# Patient Record
Sex: Female | Born: 1955
Health system: Southern US, Community
[De-identification: ages and names within clinical notes are randomized; demographics above are authoritative.]

## PROBLEM LIST (undated history)

## (undated) DIAGNOSIS — R7303 Prediabetes: Secondary | ICD-10-CM

## (undated) DIAGNOSIS — M549 Dorsalgia, unspecified: Secondary | ICD-10-CM

## (undated) DIAGNOSIS — E785 Hyperlipidemia, unspecified: Secondary | ICD-10-CM

## (undated) DIAGNOSIS — E559 Vitamin D deficiency, unspecified: Secondary | ICD-10-CM

## (undated) HISTORY — DX: Hyperlipidemia, unspecified: E78.5

## (undated) HISTORY — DX: Dorsalgia, unspecified: M54.9

## (undated) HISTORY — DX: Vitamin D deficiency, unspecified: E55.9

## (undated) HISTORY — DX: Prediabetes: R73.03

---

## 2011-05-14 LAB — HM COLONOSCOPY

## 2012-03-04 ENCOUNTER — Other Ambulatory Visit: Payer: Self-pay

## 2012-03-04 DIAGNOSIS — Z1231 Encounter for screening mammogram for malignant neoplasm of breast: Secondary | ICD-10-CM

## 2012-04-06 ENCOUNTER — Ambulatory Visit
Admission: RE | Admit: 2012-04-06 | Discharge: 2012-04-06 | Disposition: A | Discharge status: Home / Self Care | Payer: Federal, State, Local not specified - PPO | Source: Ambulatory Visit

## 2012-04-06 DIAGNOSIS — Z1231 Encounter for screening mammogram for malignant neoplasm of breast: Secondary | ICD-10-CM

## 2012-07-04 ENCOUNTER — Emergency Department (HOSPITAL_COMMUNITY)
Admission: EM | Admit: 2012-07-04 | Discharge: 2012-07-04 | Disposition: A | Discharge status: Home / Self Care | Payer: Federal, State, Local not specified - PPO | Attending: Emergency Medicine | Admitting: Emergency Medicine

## 2012-07-04 ENCOUNTER — Encounter (HOSPITAL_COMMUNITY): Payer: Self-pay | Admitting: Emergency Medicine

## 2012-07-04 DIAGNOSIS — R519 Headache, unspecified: Secondary | ICD-10-CM

## 2012-07-04 DIAGNOSIS — Y92410 Unspecified street and highway as the place of occurrence of the external cause: Secondary | ICD-10-CM | POA: Insufficient documentation

## 2012-07-04 DIAGNOSIS — S0993XA Unspecified injury of face, initial encounter: Secondary | ICD-10-CM | POA: Insufficient documentation

## 2012-07-04 DIAGNOSIS — Y9389 Activity, other specified: Secondary | ICD-10-CM | POA: Insufficient documentation

## 2012-07-04 DIAGNOSIS — S199XXA Unspecified injury of neck, initial encounter: Secondary | ICD-10-CM | POA: Insufficient documentation

## 2012-07-04 DIAGNOSIS — IMO0002 Reserved for concepts with insufficient information to code with codable children: Secondary | ICD-10-CM | POA: Insufficient documentation

## 2012-07-04 DIAGNOSIS — S0990XA Unspecified injury of head, initial encounter: Secondary | ICD-10-CM | POA: Insufficient documentation

## 2012-07-04 DIAGNOSIS — M549 Dorsalgia, unspecified: Secondary | ICD-10-CM

## 2012-07-04 MED ORDER — CYCLOBENZAPRINE HCL 10 MG PO TABS: 10.0000 mg | ORAL_TABLET | Freq: Two times a day (BID) | ORAL | Status: DC | PRN

## 2012-07-04 MED ORDER — TRAMADOL HCL 50 MG PO TABS: 50.0000 mg | ORAL_TABLET | Freq: Four times a day (QID) | ORAL | Status: DC | PRN
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
## 2012-07-05 NOTE — ED Provider Notes (Signed)
Medical screening examination/treatment/procedure(s) were performed by non-physician practitioner and as supervising physician I was immediately available for consultation/collaboration.  Kinslie Hove E Erasto Sleight, MD 07/05/12 0749 

## 2012-07-31 ENCOUNTER — Ambulatory Visit
Admission: RE | Admit: 2012-07-31 | Discharge: 2012-07-31 | Disposition: A | Discharge status: Home / Self Care | Payer: Federal, State, Local not specified - PPO | Source: Ambulatory Visit | Attending: Nurse Practitioner | Admitting: Nurse Practitioner

## 2012-07-31 ENCOUNTER — Other Ambulatory Visit: Payer: Self-pay | Admitting: Nurse Practitioner

## 2012-07-31 DIAGNOSIS — M545 Low back pain, unspecified: Secondary | ICD-10-CM

## 2012-07-31 DIAGNOSIS — M542 Cervicalgia: Secondary | ICD-10-CM

## 2013-05-25 ENCOUNTER — Other Ambulatory Visit: Payer: Self-pay

## 2013-05-25 DIAGNOSIS — Z1231 Encounter for screening mammogram for malignant neoplasm of breast: Secondary | ICD-10-CM

## 2013-06-01 ENCOUNTER — Encounter (INDEPENDENT_AMBULATORY_CARE_PROVIDER_SITE_OTHER): Payer: Self-pay

## 2013-06-01 ENCOUNTER — Ambulatory Visit
Admission: RE | Admit: 2013-06-01 | Discharge: 2013-06-01 | Disposition: A | Discharge status: Home / Self Care | Payer: Federal, State, Local not specified - PPO | Source: Ambulatory Visit

## 2013-06-01 DIAGNOSIS — Z1231 Encounter for screening mammogram for malignant neoplasm of breast: Secondary | ICD-10-CM

## 2013-09-02 ENCOUNTER — Ambulatory Visit (INDEPENDENT_AMBULATORY_CARE_PROVIDER_SITE_OTHER): Payer: Federal, State, Local not specified - PPO | Admitting: Neurology

## 2013-09-02 ENCOUNTER — Ambulatory Visit (INDEPENDENT_AMBULATORY_CARE_PROVIDER_SITE_OTHER): Payer: Self-pay | Admitting: Radiology

## 2013-09-02 DIAGNOSIS — G5603 Carpal tunnel syndrome, bilateral upper limbs: Secondary | ICD-10-CM

## 2013-09-02 DIAGNOSIS — Z0289 Encounter for other administrative examinations: Secondary | ICD-10-CM

## 2013-09-02 DIAGNOSIS — G5600 Carpal tunnel syndrome, unspecified upper limb: Secondary | ICD-10-CM

## 2013-09-03 NOTE — Progress Notes (Signed)
  [facility]   Provider:  Dr Jaynee Eagles Referring Provider: Cristine Polio, MD Primary Care Physician:  No primary provider on file.  HPI:  Krista Miles is a 58 y.o. female with a PMHx of diabetes who is here as a referral from Dr. Towanda Malkin for bilateral Carpal Tunnel evaluation. Patient reports that both her hands ache. Digits 1-4 are affected. She wakes up in the middle of the night with numbness and tingling in her hands. Right hand is worse. Denies neck pain and radicular symptoms. No paresthesias in the toes. Focused exam demonstrates some mild left thenar flattening and weak median intrinsic hand muscles.   Summary:  Evaluation of the left median motor nerve showed prolonged distal onset latency (6.1 ms, N<4.).  The right median motor nerve showed prolonged distal onset latency (8.7 ms, N<4.0).   The left Median 2nd Digit sensory nerve showed no response.  The right Median 2nd Digit sensory nerve showed no response.   Evaluation of the left Ulnar ADM motor nerve showed an 11 m/s drop across the elbow section (N<24m/s).    F Wave studies indicate that the right median F wave has prolonged latency (35.22ms, N<34).  EMG needle study of the right Deltoid, Triceps, Biceps, Pronator Teres, Flexor Digitorum Profundus(Ulnar head) and First Dorsal Interosseus muscles were normal. Needle study terminated prematurely as patient could not tolerate.   Conclusion: This is a limited study as the EMG needle exam was terminated prematurely due to pain. There is moderately-severe right > left bilateral Carpal Tunnel Syndrome. 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 Unable to assess for radiculopathy without more thorough EMG study. No indication of peripheral polyneuropathy.   [name], MD  [facility] [address] [place], [place] [address]  Phone [phone] Fax [phone]

## 2014-01-02 ENCOUNTER — Emergency Department (HOSPITAL_COMMUNITY)
Admission: EM | Admit: 2014-01-02 | Discharge: 2014-01-02 | Disposition: A | Discharge status: Home / Self Care | Payer: Federal, State, Local not specified - PPO | Attending: Emergency Medicine | Admitting: Emergency Medicine

## 2014-01-02 ENCOUNTER — Emergency Department (HOSPITAL_COMMUNITY): Payer: Federal, State, Local not specified - PPO

## 2014-01-02 DIAGNOSIS — Z79899 Other long term (current) drug therapy: Secondary | ICD-10-CM | POA: Insufficient documentation

## 2014-01-02 DIAGNOSIS — R1013 Epigastric pain: Secondary | ICD-10-CM | POA: Insufficient documentation

## 2014-01-02 DIAGNOSIS — R079 Chest pain, unspecified: Secondary | ICD-10-CM | POA: Diagnosis not present

## 2014-01-02 DIAGNOSIS — R110 Nausea: Secondary | ICD-10-CM | POA: Insufficient documentation

## 2014-01-02 DIAGNOSIS — Z791 Long term (current) use of non-steroidal anti-inflammatories (NSAID): Secondary | ICD-10-CM | POA: Diagnosis not present

## 2014-01-02 LAB — I-STAT TROPONIN, ED: Troponin i, poc: 0 ng/mL (ref 0.00–0.08)

## 2014-01-02 LAB — BASIC METABOLIC PANEL
Anion gap: 5 (ref 5–15)
BUN: 12 mg/dL (ref 6–23)
CO2: 30 mmol/L (ref 19–32)
Calcium: 9.1 mg/dL (ref 8.4–10.5)
Chloride: 104 mEq/L (ref 96–112)
Creatinine, Ser: 0.81 mg/dL (ref 0.50–1.10)
GFR calc Af Amer: >90 mL/min (ref >90)
GFR calc non Af Amer: 78 mL/min — ABNORMAL LOW (ref >90)
Glucose, Bld: 107 mg/dL — ABNORMAL HIGH (ref 70–99)
Potassium: 4 mmol/L (ref 3.5–5.1)
Sodium: 139 mmol/L (ref 135–145)

## 2014-01-02 LAB — CBC
HCT: 38.8 % (ref 36.0–46.0)
Hemoglobin: 12.3 g/dL (ref 12.0–15.0)
MCH: 29.4 pg (ref 26.0–34.0)
MCHC: 31.7 g/dL (ref 30.0–36.0)
MCV: 92.8 fL (ref 78.0–100.0)
Platelets: 239 10*3/uL (ref 150–400)
RBC: 4.18 MIL/uL (ref 3.87–5.11)
RDW: 13.1 % (ref 11.5–15.5)
WBC: 6.1 10*3/uL (ref 4.0–10.5)

## 2014-01-02 MED ORDER — SUCRALFATE 1 G PO TABS: 1.0000 g | ORAL_TABLET | Freq: Three times a day (TID) | ORAL | Status: DC

## 2014-01-02 MED ORDER — FAMOTIDINE 20 MG PO TABS: 20.0000 mg | ORAL_TABLET | Freq: Two times a day (BID) | ORAL | Status: DC

## 2014-01-02 MED ORDER — PANTOPRAZOLE SODIUM 20 MG PO TBEC: 20.0000 mg | DELAYED_RELEASE_TABLET | Freq: Every day | ORAL | Status: DC

## 2014-01-02 MED ORDER — GI COCKTAIL ~~LOC~~
30.0000 mL | Freq: Once | ORAL | Status: AC
  Administered 2014-01-02: 30 mL via ORAL
  Filled 2014-01-02: qty 30

## 2014-01-02 NOTE — Discharge Instructions (Signed)
Take protonix and pepcid as prescribed daily. carafate as needed. Please follow up with primary care doctor and gastroenterologist as soon as able for recheck and further evaluation. Return if symptoms worsening.     Gastritis, Adult Gastritis is soreness and swelling (inflammation) of the lining of the stomach. Gastritis can develop as a sudden onset (acute) or long-term (chronic) condition. If gastritis is not treated, it can lead to stomach bleeding and ulcers. CAUSES  Gastritis occurs when the stomach lining is weak or damaged. Digestive juices from the stomach then inflame the weakened stomach lining. The stomach lining may be weak or damaged due to viral or bacterial infections. One common bacterial infection is the Helicobacter pylori infection. Gastritis can also result from excessive alcohol consumption, taking certain medicines, or having too much acid in the stomach.  SYMPTOMS  In some cases, there are no symptoms. When symptoms are present, they may include:  Pain or a burning sensation in the upper abdomen.  Nausea.  Vomiting.  An uncomfortable feeling of fullness after eating. DIAGNOSIS  Your caregiver may suspect you have gastritis based on your symptoms and a physical exam. To determine the cause of your gastritis, your caregiver may perform the following:  Blood or stool tests to check for the H pylori bacterium.  Gastroscopy. A thin, flexible tube (endoscope) is passed down the esophagus and into the stomach. The endoscope has a light and camera on the end. Your caregiver uses the endoscope to view the inside of the stomach.  Taking a tissue sample (biopsy) from the stomach to examine under a microscope. TREATMENT  Depending on the cause of your gastritis, medicines may be prescribed. If you have a bacterial infection, such as an H pylori infection, antibiotics may be given. If your gastritis is caused by too much acid in the stomach, H2 blockers or antacids may be given.  Your caregiver may recommend that you stop taking aspirin, ibuprofen, or other nonsteroidal anti-inflammatory drugs (NSAIDs). HOME CARE INSTRUCTIONS  Only take over-the-counter or prescription medicines as directed by your caregiver.  If you were given antibiotic medicines, take them as directed. Finish them even if you start to feel better.  Drink enough fluids to keep your urine clear or pale yellow.  Avoid foods and drinks that make your symptoms worse, such as:  Caffeine or alcoholic drinks.  Chocolate.  Peppermint or mint flavorings.  Garlic and onions.  Spicy foods.  Citrus fruits, such as oranges, lemons, or limes.  Tomato-based foods such as sauce, chili, salsa, and pizza.  Fried and fatty foods.  Eat small, frequent meals instead of large meals. SEEK IMMEDIATE MEDICAL CARE IF:   You have black or dark red stools.  You vomit blood or material that looks like coffee grounds.  You are unable to keep fluids down.  Your abdominal pain gets worse.  You have a fever.  You do not feel better after 1 week.  You have any other questions or concerns. MAKE SURE YOU:  Understand these instructions.  Will watch your condition.  Will get help right away if you are not doing well or get worse. Document Released: 12/18/2000 Document Revised: 06/25/2011 Document Reviewed: 02/06/2011 [facility] Patient Information 2015 [place], [place]. This information is not intended to replace advice given to you by your health care provider. Make sure you discuss any questions you have with your health care provider.  Peptic Ulcer A peptic ulcer is a sore in the lining of your esophagus (esophageal ulcer), stomach (gastric  ulcer), or in the first part of your small intestine (duodenal ulcer). The ulcer causes erosion into the deeper tissue. CAUSES  Normally, the lining of the stomach and the small intestine protects itself from the acid that digests food. The protective lining can be  damaged by:  An infection caused by a bacterium called Helicobacter pylori (H. pylori).  Regular use of nonsteroidal anti-inflammatory drugs (NSAIDs), such as ibuprofen or aspirin.  Smoking tobacco. Other risk factors include being older than 53, drinking alcohol excessively, and having a family history of ulcer disease.  SYMPTOMS   Burning pain or gnawing in the area between the chest and the belly button.  Heartburn.  Nausea and vomiting.  Bloating. The pain can be worse on an empty stomach and at night. If the ulcer results in bleeding, it can cause:  Black, tarry stools.  Vomiting of bright red blood.  Vomiting of coffee-ground-looking materials. DIAGNOSIS  A diagnosis is usually made based upon your history and an exam. Other tests and procedures may be performed to find the cause of the ulcer. Finding a cause will help determine the best treatment. Tests and procedures may include:  Blood tests, stool tests, or breath tests to check for the bacterium H. pylori.  An upper gastrointestinal (GI) series of the esophagus, stomach, and small intestine.  An endoscopy to examine the esophagus, stomach, and small intestine.  A biopsy. TREATMENT  Treatment may include:  Eliminating the cause of the ulcer, such as smoking, NSAIDs, or alcohol.  Medicines to reduce the amount of acid in your digestive tract.  Antibiotic medicines if the ulcer is caused by the H. pylori bacterium.  An upper endoscopy to treat a bleeding ulcer.  Surgery if the bleeding is severe or if the ulcer created a hole somewhere in the digestive system. HOME CARE INSTRUCTIONS   Avoid tobacco, alcohol, and caffeine. Smoking can increase the acid in the stomach, and continued smoking will impair the healing of ulcers.  Avoid foods and drinks that seem to cause discomfort or aggravate your ulcer.  Only take medicines as directed by your caregiver. Do not substitute over-the-counter medicines for  prescription medicines without talking to your caregiver.  Keep any follow-up appointments and tests as directed. SEEK MEDICAL CARE IF:   Your do not improve within 7 days of starting treatment.  You have ongoing indigestion or heartburn. SEEK IMMEDIATE MEDICAL CARE IF:   You have sudden, sharp, or persistent abdominal pain.  You have bloody or dark black, tarry stools.  You vomit blood or vomit that looks like coffee grounds.  You become light-headed, weak, or feel faint.  You become sweaty or clammy. MAKE SURE YOU:   Understand these instructions.  Will watch your condition.  Will get help right away if you are not doing well or get worse. Document Released: 12/22/1999 Document Revised: 05/10/2013 Document Reviewed: 07/24/2011 [facility] Patient Information 2015 [place], [place]. This information is not intended to replace advice given to you by your health care provider. Make sure you discuss any questions you have with your health care provider.

## 2014-01-02 NOTE — ED Notes (Signed)
Pt reports sternal/chest pain, esophageal area x3 weeks. Pain 710. Reports sometimes nexium helps. Denies n/v/d.

## 2014-01-02 NOTE — ED Notes (Signed)
Awake. Verbally responsive. A/O x4. Resp even and unlabored. No audible adventitious breath sounds noted. ABC's intact. SR on monitor at 71bpm. NAD noted.

## 2014-01-02 NOTE — ED Provider Notes (Signed)
CSN: [id number]     Arrival date & time 01/02/14  1735 History   First MD Initiated Contact with Patient 01/02/14 2006     Chief Complaint  Patient presents with  . Chest Pain     (Consider location/radiation/quality/duration/timing/severity/associated sxs/prior Treatment) HPI Krista Miles is a 58 y.o. female with no medical problems, presents to emergency department complaining of epigastric abdominal pain and chest pain. Patient states pain has been intermittent for the last 3 weeks. States mainly worse with eating. States at times it is painful even when she does not eat. No exertional symptoms. No shortness of breath. No associated diaphoresis, dizziness, lightheadedness. Currently asymptomatic. States she has not seen her doctor for this. No prior cardiac history. Recently started on NSAIDs for pain. She admits to heavy caffeine use. Denies any fever or chills. No urinary symptoms. No recent travel or surgeries. No lower extremity swelling.  No past medical history on file. No past surgical history on file. No family history on file. History  Substance Use Topics  . Smoking status: Never Smoker   . Smokeless tobacco: Not on file  . Alcohol Use: No   OB History    No data available     Review of Systems  Constitutional: Negative for fever and chills.  Respiratory: Positive for chest tightness. Negative for cough and shortness of breath.   Cardiovascular: Positive for chest pain. Negative for palpitations and leg swelling.  Gastrointestinal: Positive for nausea and abdominal pain. Negative for vomiting, diarrhea and constipation.  Genitourinary: Negative for dysuria and flank pain.  Musculoskeletal: Negative for myalgias, arthralgias, neck pain and neck stiffness.  Skin: Negative for rash.  Neurological: Negative for dizziness, weakness and headaches.  All other systems reviewed and are negative.     Allergies  Review of patient's allergies indicates no known  allergies.  Home Medications   Prior to Admission medications   Medication Sig Start Date End Date Taking? Authorizing Provider  acetaminophen (TYLENOL) 500 MG tablet Take 500 mg by mouth every 6 (six) hours as needed for pain.   Yes Historical Provider, MD  diclofenac (VOLTAREN) 75 MG EC tablet Take 75 mg by mouth 2 (two) times daily. Take twice a day with food for 2 weeks, then 1 tablet for 2 weeks and then take 1 tablet as needed   Yes Historical Provider, MD  metFORMIN (GLUCOPHAGE) 500 MG tablet Take 500 mg by mouth daily with breakfast.   Yes Historical Provider, MD  traMADol (ULTRAM) 50 MG tablet Take 1 tablet (50 mg total) by mouth every 6 (six) hours as needed for pain. 07/04/12  Yes Jennifer L Piepenbrink, PA-C  cyclobenzaprine (FLEXERIL) 10 MG tablet Take 1 tablet (10 mg total) by mouth 2 (two) times daily as needed for muscle spasms. Patient not taking: Reported on 01/02/2014 07/04/12   Stephani Police Piepenbrink, PA-C  famotidine (PEPCID) 20 MG tablet Take 1 tablet (20 mg total) by mouth 2 (two) times daily. 01/02/14   Karyna Bessler A Eretria Manternach, PA-C  pantoprazole (PROTONIX) 20 MG tablet Take 1 tablet (20 mg total) by mouth daily. 01/02/14   Jonise Weightman A Malania Gawthrop, PA-C  sucralfate (CARAFATE) 1 G tablet Take 1 tablet (1 g total) by mouth 4 (four) times daily -  with meals and at bedtime. 01/02/14   Nelwyn Hebdon A Brileigh Sevcik, PA-C   BP 119/67 mmHg  Pulse 72  Temp(Src) 98.5 F (36.9 C) (Oral)  Resp 11  SpO2 100% Physical Exam  Constitutional: She appears well-developed and well-nourished. No  distress.  HENT:  Head: Normocephalic.  Eyes: Conjunctivae are normal.  Neck: Neck supple.  Cardiovascular: Normal rate, regular rhythm and normal heart sounds.   Pulmonary/Chest: Effort normal and breath sounds normal. No respiratory distress. She has no wheezes. She has no rales. She exhibits no tenderness.  Abdominal: Soft. Bowel sounds are normal. She exhibits no distension. There is tenderness. There  is no rebound.  Epigastric tenderness  Musculoskeletal: She exhibits no edema.  Neurological: She is alert.  Skin: Skin is warm and dry.  Psychiatric: She has a normal mood and affect. Her behavior is normal.  Nursing note and vitals reviewed.   ED Course  Procedures (including critical care time) Labs Review Labs Reviewed  BASIC METABOLIC PANEL - Abnormal; Notable for the following:    Glucose, Bld 107 (*)    GFR calc non Af Amer 78 (*)    All other components within normal limits  CBC  I-STAT TROPOININ, ED    Imaging Review Dg Chest 2 View  01/02/2014   CLINICAL DATA:  Chest pain.  EXAM: CHEST  2 VIEW  COMPARISON:  None.  FINDINGS: Minimal lingular atelectasis or scarring. Right lung is clear. No effusions. Heart is normal size. Mediastinal contours are within normal limits. No acute bony abnormality.  IMPRESSION: Minimal lingular scarring or atelectasis.  No active disease.   Electronically Signed   By: Rolm Baptise M.D.   On: 01/02/2014 20:56     EKG Interpretation   Date/Time:  Sunday January 02 2014 17:45:51 EST Ventricular Rate:  88 PR Interval:  160 QRS Duration: 74 QT Interval:  353 QTC Calculation: 427 R Axis:   57 Text Interpretation:  Sinus rhythm Low voltage, precordial leads  Borderline T abnormalities, anterior leads Confirmed by KOHUT  MD, STEPHEN  ([id number]) on 01/02/2014 9:08:14 PM      MDM   Final diagnoses:  Chest pain  Epigastric pain    Pt with epigastric abdominal pain, chest pain, mainly after eating. States "I think i have ulcers." pt has no prior medical problems. Pt is low risk for cardiac disease, her TIMI score is 0, heart score of 2. Labs and cxr ordered.    Pt's labs and CXR negative. ECG with non specific findings. Given low risk wll dc home. Most likely gastritis. Pt received GI cocktail and feels much better. Will d/c home with close outpatient follow up. Will treat with pepcid, carafate, protonix.   Filed Vitals:   01/02/14 1746  01/02/14 2101  BP: 111/73 119/67  Pulse: 86 72  Temp: 98.5 F (36.9 C)   TempSrc: Oral   Resp: 20 11  SpO2: 100% 100%      Renold Genta, PA-C 01/02/14 2321  Virgel Manifold, MD 01/03/14 1455

## 2014-01-02 NOTE — ED Notes (Signed)
Awake. Verbally responsive. A/O x4. Resp even and unlabored. No audible adventitious breath sounds noted. ABC's intact. SR on monitor at 81bpm.

## 2014-01-07 HISTORY — PX: HAND SURGERY: SHX662

## 2014-05-30 ENCOUNTER — Other Ambulatory Visit: Payer: Self-pay

## 2014-05-30 DIAGNOSIS — Z1231 Encounter for screening mammogram for malignant neoplasm of breast: Secondary | ICD-10-CM

## 2014-06-03 ENCOUNTER — Ambulatory Visit
Admission: RE | Admit: 2014-06-03 | Discharge: 2014-06-03 | Disposition: A | Discharge status: Home / Self Care | Payer: Federal, State, Local not specified - PPO | Source: Ambulatory Visit

## 2014-06-03 DIAGNOSIS — Z1231 Encounter for screening mammogram for malignant neoplasm of breast: Secondary | ICD-10-CM

## 2015-05-15 DIAGNOSIS — Z0000 Encounter for general adult medical examination without abnormal findings: Secondary | ICD-10-CM | POA: Diagnosis not present

## 2015-05-15 DIAGNOSIS — H6123 Impacted cerumen, bilateral: Secondary | ICD-10-CM | POA: Diagnosis not present

## 2015-06-26 DIAGNOSIS — J029 Acute pharyngitis, unspecified: Secondary | ICD-10-CM | POA: Diagnosis not present

## 2015-07-15 DIAGNOSIS — E119 Type 2 diabetes mellitus without complications: Secondary | ICD-10-CM | POA: Diagnosis not present

## 2015-08-07 DIAGNOSIS — R51 Headache: Secondary | ICD-10-CM | POA: Diagnosis not present

## 2015-08-07 DIAGNOSIS — G5603 Carpal tunnel syndrome, bilateral upper limbs: Secondary | ICD-10-CM | POA: Diagnosis not present

## 2015-08-07 DIAGNOSIS — M542 Cervicalgia: Secondary | ICD-10-CM | POA: Diagnosis not present

## 2015-08-07 DIAGNOSIS — E785 Hyperlipidemia, unspecified: Secondary | ICD-10-CM | POA: Diagnosis not present

## 2015-08-30 DIAGNOSIS — M9902 Segmental and somatic dysfunction of thoracic region: Secondary | ICD-10-CM | POA: Diagnosis not present

## 2015-08-30 DIAGNOSIS — M5032 Other cervical disc degeneration, mid-cervical region, unspecified level: Secondary | ICD-10-CM | POA: Diagnosis not present

## 2015-08-30 DIAGNOSIS — M531 Cervicobrachial syndrome: Secondary | ICD-10-CM | POA: Diagnosis not present

## 2015-08-30 DIAGNOSIS — M9901 Segmental and somatic dysfunction of cervical region: Secondary | ICD-10-CM | POA: Diagnosis not present

## 2015-10-02 DIAGNOSIS — M531 Cervicobrachial syndrome: Secondary | ICD-10-CM | POA: Diagnosis not present

## 2015-10-02 DIAGNOSIS — M9902 Segmental and somatic dysfunction of thoracic region: Secondary | ICD-10-CM | POA: Diagnosis not present

## 2015-10-02 DIAGNOSIS — M9901 Segmental and somatic dysfunction of cervical region: Secondary | ICD-10-CM | POA: Diagnosis not present

## 2015-10-02 DIAGNOSIS — M5032 Other cervical disc degeneration, mid-cervical region, unspecified level: Secondary | ICD-10-CM | POA: Diagnosis not present

## 2015-10-25 ENCOUNTER — Other Ambulatory Visit: Payer: Self-pay | Admitting: Obstetrics and Gynecology

## 2015-10-25 DIAGNOSIS — Z1231 Encounter for screening mammogram for malignant neoplasm of breast: Secondary | ICD-10-CM

## 2015-11-16 ENCOUNTER — Ambulatory Visit
Admission: RE | Admit: 2015-11-16 | Discharge: 2015-11-16 | Disposition: A | Discharge status: Home / Self Care | Payer: Federal, State, Local not specified - PPO | Source: Ambulatory Visit | Attending: Obstetrics and Gynecology | Admitting: Obstetrics and Gynecology

## 2015-11-16 DIAGNOSIS — Z1231 Encounter for screening mammogram for malignant neoplasm of breast: Secondary | ICD-10-CM

## 2015-11-20 DIAGNOSIS — R7309 Other abnormal glucose: Secondary | ICD-10-CM | POA: Diagnosis not present

## 2015-11-20 DIAGNOSIS — E785 Hyperlipidemia, unspecified: Secondary | ICD-10-CM | POA: Diagnosis not present

## 2015-11-20 DIAGNOSIS — R51 Headache: Secondary | ICD-10-CM | POA: Diagnosis not present

## 2015-11-20 DIAGNOSIS — Z79899 Other long term (current) drug therapy: Secondary | ICD-10-CM | POA: Diagnosis not present

## 2015-12-07 DIAGNOSIS — K080 Exfoliation of teeth due to systemic causes: Secondary | ICD-10-CM | POA: Diagnosis not present

## 2015-12-14 DIAGNOSIS — Z01419 Encounter for gynecological examination (general) (routine) without abnormal findings: Secondary | ICD-10-CM | POA: Diagnosis not present

## 2015-12-14 DIAGNOSIS — R635 Abnormal weight gain: Secondary | ICD-10-CM | POA: Diagnosis not present

## 2015-12-14 DIAGNOSIS — Z1151 Encounter for screening for human papillomavirus (HPV): Secondary | ICD-10-CM | POA: Diagnosis not present

## 2015-12-14 DIAGNOSIS — Z6833 Body mass index (BMI) 33.0-33.9, adult: Secondary | ICD-10-CM | POA: Diagnosis not present

## 2016-02-07 ENCOUNTER — Telehealth (INDEPENDENT_AMBULATORY_CARE_PROVIDER_SITE_OTHER): Payer: Self-pay | Admitting: *Deleted

## 2016-02-07 NOTE — Telephone Encounter (Signed)
Patient called in this afternoon in regards to needing some paperwork filled out again? Her CB # [phone]. Thank you

## 2016-02-07 NOTE — Telephone Encounter (Signed)
Can you please call her, I think she is referring to a disab/FMLA paper.  Could you tell her what the protocol is now pls?

## 2016-02-08 ENCOUNTER — Ambulatory Visit (INDEPENDENT_AMBULATORY_CARE_PROVIDER_SITE_OTHER): Admitting: Orthopedic Surgery

## 2016-02-08 DIAGNOSIS — G5601 Carpal tunnel syndrome, right upper limb: Secondary | ICD-10-CM | POA: Diagnosis not present

## 2016-02-08 DIAGNOSIS — G5602 Carpal tunnel syndrome, left upper limb: Secondary | ICD-10-CM | POA: Diagnosis not present

## 2016-02-08 DIAGNOSIS — G5603 Carpal tunnel syndrome, bilateral upper limbs: Secondary | ICD-10-CM

## 2016-02-08 MED ORDER — DICLOFENAC SODIUM 2 % TD SOLN: 1.0000 | Freq: Two times a day (BID) | TRANSDERMAL | 1 refills | Status: DC

## 2016-02-12 NOTE — Progress Notes (Signed)
Office Visit Note   Patient: Krista Miles           Date of Birth: 04/13/1955           MRN: [id number] Visit Date: 02/08/2016 Requested by: Glendale Chard, MD [address] [place], [place] [address] PCP: Maximino Greenland, MD  Subjective: Chief Complaint  Patient presents with  . Right Hand - Follow-up    Patient states she still has bilateral hand pain and soreness. She also states the labor dept needs letter written, she has brought the forms to go by.  . Left Hand - Follow-up    HPI Krista Miles is a 61 year old female with bilateral carpal tunnel syndrome as well as de Quervain's tenosynovium vulvitis on the right wrist.  She underwent carpal tunnel release and de Quervain's first compartment release on the right 01/17/2014 and carpal tunnel release only on the left 05/23/2014.  She underwent physical therapy and has returned to work since her surgery.  She is rated at 3% permanent partial disability of both wrist.  Impairment rating reviewed..              Review of Systems All systems reviewed are negative as they relate to the chief complaint within the history of present illness.  Patient denies  fevers or chills.    Assessment & Plan: Visit Diagnoses:  1. Bilateral carpal tunnel syndrome     Plan: Patient's disability rating appears correct.  Quick dash-score for right hand questions 1 through 11  - 5, 5, 3, 5, 2, 3, 3, 3, 5, 1, 1, for the right hand for total of 36 - for the left hand the score is 4, 4, 3, 5, 2, 3, 3, 2, 4, 1, 1, for total score 32.  Table 50 T-23 page 449 sixth edition grade modifier for test finding is one for conduction delay sensory and/or motor.  Grade modifier for exam is 3 for weakness.  Grip strength testing right hand 15, 19, 25, 20, grip strength testing left hand 15, 20, 23, 18,.  Grade modifier for history is 2 for significant intermittent symptoms.  Average of grade modifiers is 2.  Quick-score functional scale rating is 1.   Therefore 3% for both hands is reasonable based on sixth edition of guide to the evaluation of permanent impairment  Follow-Up Instructions: Return if symptoms worsen or fail to improve.   Orders:  No orders of the defined types were placed in this encounter.  Meds ordered this encounter  Medications  . DISCONTD: Diclofenac Sodium (PENNSAID) 2 % SOLN    Sig: Place 1 Squirt onto the skin 2 (two) times daily.    Dispense:  1 Bottle    Refill:  1  . Diclofenac Sodium (PENNSAID) 2 % SOLN    Sig: Place 1 Squirt onto the skin 2 (two) times daily.    Dispense:  1 Bottle    Refill:  1      Procedures: No procedures performed   Clinical Data: No additional findings.  Objective: Vital Signs: There were no vitals taken for this visit.  Physical Exam   Constitutional: Patient appears well-developed HEENT:  Head: Normocephalic Eyes:EOM are normal Neck: Normal range of motion Cardiovascular: Normal rate Pulmonary/chest: Effort normal Neurologic: Patient is alert Skin: Skin is warm Psychiatric: Patient has normal mood and affect    Ortho Exam examination of the hands demonstrates decreased grip strength well-healed incisions good wrist range of motion not much in the way  of scar tenderness the rest of the exam findings can be found in the plan  Specialty Comments:  No specialty comments available.  Imaging: No results found.   PMFS History: There are no active problems to display for this patient.  No past medical history on file.  No family history on file.  No past surgical history on file. Social History   Occupational History  . Not on file.   Social History Main Topics  . Smoking status: Never Smoker  . Smokeless tobacco: Not on file  . Alcohol use No  . Drug use: No  . Sexual activity: Not on file

## 2016-02-19 ENCOUNTER — Telehealth (INDEPENDENT_AMBULATORY_CARE_PROVIDER_SITE_OTHER): Payer: Self-pay | Admitting: Orthopedic Surgery

## 2016-02-19 NOTE — Telephone Encounter (Signed)
The patient said it wasn't an FMLA form. She said left it with Dr. Marlou Sa last week. It was something from the Dept. Of Labor and she said he needed to rewrite some part of it? Her phone is [phone].

## 2016-02-19 NOTE — Telephone Encounter (Signed)
Patient called needing some information on her labor department forms. CB # [phone]

## 2016-02-19 NOTE — Telephone Encounter (Signed)
This may be for you? 

## 2016-02-19 NOTE — Telephone Encounter (Signed)
Please advise, thank you.

## 2016-02-21 NOTE — Telephone Encounter (Signed)
Done, see other msg, faxed

## 2016-02-21 NOTE — Telephone Encounter (Signed)
IC pt. She asks that we fax note, [phone]

## 2016-02-26 ENCOUNTER — Encounter (INDEPENDENT_AMBULATORY_CARE_PROVIDER_SITE_OTHER): Payer: Self-pay | Admitting: Orthopedic Surgery

## 2016-02-26 ENCOUNTER — Ambulatory Visit (INDEPENDENT_AMBULATORY_CARE_PROVIDER_SITE_OTHER): Payer: Self-pay

## 2016-02-26 ENCOUNTER — Ambulatory Visit (INDEPENDENT_AMBULATORY_CARE_PROVIDER_SITE_OTHER): Payer: Federal, State, Local not specified - PPO | Admitting: Orthopedic Surgery

## 2016-02-26 DIAGNOSIS — M25562 Pain in left knee: Secondary | ICD-10-CM

## 2016-02-26 DIAGNOSIS — G8929 Other chronic pain: Secondary | ICD-10-CM

## 2016-02-26 MED ORDER — DICLOFENAC SODIUM 2 % TD SOLN: 1.0000 | Freq: Two times a day (BID) | TRANSDERMAL | 1 refills | Status: DC

## 2016-02-28 ENCOUNTER — Encounter (INDEPENDENT_AMBULATORY_CARE_PROVIDER_SITE_OTHER): Payer: Self-pay | Admitting: Orthopedic Surgery

## 2016-02-28 NOTE — Progress Notes (Signed)
   Office Visit Note   Patient: Krista Miles           Date of Birth: 08-Apr-1955           MRN: [id number] Visit Date: 02/26/2016 Requested by: Glendale Chard, MD [address] [place], [place] [address] PCP: Maximino Greenland, MD  Subjective: Chief Complaint  Patient presents with  . Left Knee - Pain    HPI Krista Miles is a 61 year old patient with left knee pain for 1 year duration.  She localizes the pain to the medial tibial plateau region around the hamstring attachment area.  She wonders if she pulled the ligament.  States that it aches all the time.  Wants to give way at times but she denies any catching or locking.  She takes gabapentin and Aleve and uses Biofreeze.              Review of Systems All systems reviewed are negative as they relate to the chief complaint within the history of present illness.  Patient denies  fevers or chills.    Assessment & Plan: Visit Diagnoses:  1. Chronic pain of left knee     Plan: Impression is left knee hamstring tendinitis which is insertional at the pes bursa region.  She may have a component of pes bursitis as well.  Plan initially is for topical treatment.  Prescription sent into the pharmacy.  Next step would be ultrasound-guided injection of the pes bursa all tendons at that interface between the tendons and the MCL.  I will see her back as needed  Follow-Up Instructions: No Follow-up on file.   Orders:  Orders Placed This Encounter  Procedures  . XR KNEE 3 VIEW LEFT   Meds ordered this encounter  Medications  . Diclofenac Sodium (PENNSAID) 2 % SOLN    Sig: Place 1 Squirt onto the skin 2 (two) times daily.    Dispense:  1 Bottle    Refill:  1      Procedures: No procedures performed   Clinical Data: No additional findings.  Objective: Vital Signs: There were no vitals taken for this visit.  Physical Exam   Constitutional: Patient appears well-developed HEENT:  Head: Normocephalic Eyes:EOM  are normal Neck: Normal range of motion Cardiovascular: Normal rate Pulmonary/chest: Effort normal Neurologic: Patient is alert Skin: Skin is warm Psychiatric: Patient has normal mood and affect    Ortho Exam left knee exam demonstrates full active and passive range of motion stable collateral crucial ligaments and extensor mechanism no other masses lymph adenopathy or skin changes noted in the left knee region.  Collateral cruciate ligaments are stable.  She does have tenderness at the semi-tendinosis and Priscilla's attachment site onto the tibia and slightly posterior to that.  There is no focal medial joint line tenderness.  Extensor mechanism otherwise intact.  Specialty Comments:  No specialty comments available.  Imaging: No results found.   PMFS History: There are no active problems to display for this patient.  No past medical history on file.  No family history on file.  No past surgical history on file. Social History   Occupational History  . Not on file.   Social History Main Topics  . Smoking status: Never Smoker  . Smokeless tobacco: Never Used  . Alcohol use No  . Drug use: No  . Sexual activity: Not on file

## 2016-03-28 ENCOUNTER — Encounter (INDEPENDENT_AMBULATORY_CARE_PROVIDER_SITE_OTHER): Payer: Self-pay | Admitting: Orthopedic Surgery

## 2016-03-28 ENCOUNTER — Ambulatory Visit (INDEPENDENT_AMBULATORY_CARE_PROVIDER_SITE_OTHER): Admitting: Orthopedic Surgery

## 2016-03-28 DIAGNOSIS — G5602 Carpal tunnel syndrome, left upper limb: Secondary | ICD-10-CM

## 2016-03-28 DIAGNOSIS — G5601 Carpal tunnel syndrome, right upper limb: Secondary | ICD-10-CM

## 2016-03-28 NOTE — Progress Notes (Signed)
   Office Visit Note   Patient: Krista Miles           Date of Birth: [birth year]           MRN: [id number] Visit Date: 03/28/2016 Requested by: Glendale Chard, MD [address] [place], [place] [address] PCP: Maximino Greenland, MD  Subjective: Chief Complaint  Patient presents with  . Right Hand - Follow-up  . Left Hand - Follow-up    HPI: Krista Miles is a 61 year old female with bilateral hand carpal tunnel syndrome.  She's had both of the carpal tunnels released.  She's here for her paperwork to be filled out.  In general she's doing reasonably well but she's not perfect.  No real change in symptoms.  She is functional with her hands.              ROS: All systems reviewed are negative as they relate to the chief complaint within the history of present illness.  Patient denies  fevers or chills.   Assessment & Plan: Visit Diagnoses:  1. Carpal tunnel syndrome, left upper limb   2. Carpal tunnel syndrome, right upper limb     Plan: Impression is generally functional hands following carpal tunnel release.  Plan is to fill out the form allowing her to do activities that are compatible with her abilities.  I'll see her back as needed  Follow-Up Instructions: No Follow-up on file.   Orders:  No orders of the defined types were placed in this encounter.  No orders of the defined types were placed in this encounter.     Procedures: No procedures performed   Clinical Data: No additional findings.  Objective: Vital Signs: There were no vitals taken for this visit.  Physical Exam:   Constitutional: Patient appears well-developed HEENT:  Head: Normocephalic Eyes:EOM are normal Neck: Normal range of motion Cardiovascular: Normal rate Pulmonary/chest: Effort normal Neurologic: Patient is alert Skin: Skin is warm Psychiatric: Patient has normal mood and affect    Ortho Exam: Bilateral wrist exam demonstrates reasonable range of motion improving grip  strength slight tenderness at the proximal aspect of the incision on the left hand.  Good abductor pollicis brevis function no skin color changes or temperature differences left hand versus right  Specialty Comments:  No specialty comments available.  Imaging: No results found.   PMFS History: There are no active problems to display for this patient.  No past medical history on file.  No family history on file.  No past surgical history on file. Social History   Occupational History  . Not on file.   Social History Main Topics  . Smoking status: Never Smoker  . Smokeless tobacco: Never Used  . Alcohol use No  . Drug use: No  . Sexual activity: Not on file

## 2016-05-05 DIAGNOSIS — J209 Acute bronchitis, unspecified: Secondary | ICD-10-CM | POA: Diagnosis not present

## 2016-05-16 ENCOUNTER — Telehealth (INDEPENDENT_AMBULATORY_CARE_PROVIDER_SITE_OTHER): Payer: Self-pay | Admitting: Orthopedic Surgery

## 2016-05-16 DIAGNOSIS — M79642 Pain in left hand: Principal | ICD-10-CM

## 2016-05-16 DIAGNOSIS — M79641 Pain in right hand: Secondary | ICD-10-CM

## 2016-05-16 NOTE — Telephone Encounter (Signed)
Patient called asked if Dr Marlou Sa would set up for her to have a Nerve Conduction Study done. The number to contact patient is  [phone]

## 2016-05-17 NOTE — Telephone Encounter (Signed)
s/w patient and she states bilateral hand pain is getting worse and is requesting repeat EMG/NCV for both hands. I sent referral over to Dr Ernestina Patches to have this done.

## 2016-05-17 NOTE — Addendum Note (Signed)
Addended byLaurann Montana on: 05/17/2016 11:31 AM   Modules accepted: Orders

## 2016-05-20 NOTE — Telephone Encounter (Signed)
Ok thx.

## 2016-06-18 ENCOUNTER — Encounter (INDEPENDENT_AMBULATORY_CARE_PROVIDER_SITE_OTHER): Payer: Self-pay | Admitting: Physical Medicine and Rehabilitation

## 2016-07-05 ENCOUNTER — Encounter (INDEPENDENT_AMBULATORY_CARE_PROVIDER_SITE_OTHER): Payer: Self-pay | Admitting: Physical Medicine and Rehabilitation

## 2016-07-05 ENCOUNTER — Ambulatory Visit (INDEPENDENT_AMBULATORY_CARE_PROVIDER_SITE_OTHER): Admitting: Physical Medicine and Rehabilitation

## 2016-07-05 DIAGNOSIS — M79641 Pain in right hand: Secondary | ICD-10-CM | POA: Diagnosis not present

## 2016-07-05 DIAGNOSIS — M79642 Pain in left hand: Secondary | ICD-10-CM

## 2016-07-05 NOTE — Progress Notes (Signed)
Krista Miles - 61 y.o. female MRN [id number]  Date of birth: [birth year]  Office Visit Note: Visit Date: 07/05/2016 PCP: Krista Chard, MD Referred by: Krista Chard, MD  Subjective: Chief Complaint  Patient presents with  . Right Hand - Pain  . Left Hand - Pain   HPI: Krista Miles is a 61 year old right-hand dominant female with prior history of bilateral carpal tunnel release by Dr. Marlou Sa. Prior electrodiagnostic studies performed in 2015 a reviewed below. This showed per report right greater than left moderate to severe median nerve neuropathy at the wrist. The study was limited because EMG needle exam was terminated due to pain. The patient today requested only the nerve conduction studies to be repeated. We explained to her once again about the needle EMG test that she again refuses to complete the needle EMG test. Any numbness tingling or paresthesias in the hands. She does report fatigue and weakness of the hands and does have to shake her hands after driving for a while.    ROS Otherwise per HPI.  Assessment & Plan: Visit Diagnoses:  1. Pain in right hand   2. Pain in left hand     Plan: No additional findings.  Impression: The above electrodiagnostic study is ABNORMAL and reveals evidence of:  1. A moderate right median nerve entrapment at the wrist affecting sensory and motor components.   2. A mild left median nerve entrapment at the wrist affecting sensory components.   *Both these results show improvement since carpal tunnel release and prior electrodiagnostic study. It's hard to know if this is a recalcitrant a recurrent neuropathy.  There is no significant electrodiagnostic evidence of any other focal nerve entrapment, brachial plexopathy, cervical radiculopathy or generalized peripheral neuropathy. Prior electrodiagnostic report suggested order nerve neuropathy as well but there is no evidence of that on the study.  Recommendations: 1.  Follow-up with  referring physician. 2.  Continue current management of symptoms.   Meds & Orders: No orders of the defined types were placed in this encounter.   Orders Placed This Encounter  Procedures  . NCV with EMG (electromyography)    Follow-up: Return for Dr. Marlou Sa.   Procedures: No procedures performed  EMG & NCV Findings: Evaluation of the right median motor nerve showed prolonged distal onset latency (4.5 ms), reduced amplitude (4.1 mV), and decreased conduction velocity (Elbow-Wrist, 49 m/s).  The left median (across palm) sensory and the right median (across palm) sensory nerves showed prolonged distal peak latency (Wrist, L4.8, R5.0 ms) and prolonged distal peak latency (Palm, L2.1, R2.9 ms).  All remaining nerves (as indicated in the following tables) were within normal limits.  All left vs. right side differences were within normal limits.    Impression: The above electrodiagnostic study is ABNORMAL and reveals evidence of:  1. A moderate right median nerve entrapment at the wrist affecting sensory and motor components.   2. A mild left median nerve entrapment at the wrist affecting sensory components.   *Both these results show improvement since carpal tunnel release and prior electrodiagnostic study. It's hard to know if this is a recalcitrant a recurrent neuropathy.  There is no significant electrodiagnostic evidence of any other focal nerve entrapment, brachial plexopathy, cervical radiculopathy or generalized peripheral neuropathy. Prior electrodiagnostic report suggested order nerve neuropathy as well but there is no evidence of that on the study.  Recommendations: 1.  Follow-up with referring physician. 2.  Continue current management of symptoms.    Nerve Conduction Studies Anti  Sensory Summary Table   Stim Site NR Peak (ms) Norm Peak (ms) P-T Amp (V) Norm P-T Amp Site1 Site2 Delta-P (ms) Dist (cm) Vel (m/s) Norm Vel (m/s)  Left Median Acr Palm Anti Sensory (2nd Digit)   32C  Wrist    *4.8 <3.6 25.3 >10 Wrist Palm 2.7 0.0    Palm    *2.1 <2.0 8.1         Right Median Acr Palm Anti Sensory (2nd Digit)  31.8C  Wrist    *5.0 <3.6 16.8 >10 Wrist Palm 2.1 0.0    Palm    *2.9 <2.0 10.8         Left Radial Anti Sensory (Base 1st Digit)  30.5C  Wrist    2.1 <3.1 50.9  Wrist Base 1st Digit 2.1 0.0    Right Radial Anti Sensory (Base 1st Digit)  31.3C  Wrist    2.1 <3.1 31.9  Wrist Base 1st Digit 2.1 0.0    Left Ulnar Anti Sensory (5th Digit)  31.5C  Wrist    3.1 <3.7 40.6 >15.0 Wrist 5th Digit 3.1 14.0 45 >38  Right Ulnar Anti Sensory (5th Digit)  31.7C  Wrist    3.2 <3.7 24.1 >15.0 Wrist 5th Digit 3.2 14.0 44 >38   Motor Summary Table   Stim Site NR Onset (ms) Norm Onset (ms) O-P Amp (mV) Norm O-P Amp Site1 Site2 Delta-0 (ms) Dist (cm) Vel (m/s) Norm Vel (m/s)  Left Median Motor (Abd Poll Brev)  30.7C  Wrist    4.0 <4.2 8.0 >5 Elbow Wrist 4.1 21.0 51 >50  Elbow    8.1  2.8         Right Median Motor (Abd Poll Brev)  30.8C  Wrist    *4.5 <4.2 *4.1 >5 Elbow Wrist 4.4 21.5 *49 >50  Elbow    8.9  3.3         Left Ulnar Motor (Abd Dig Min)  30.7C  Wrist    2.7 <4.2 9.1 >3 B Elbow Wrist 3.4 21.0 62 >53  B Elbow    6.1  8.7  A Elbow B Elbow 1.4 10.0 71 >53  A Elbow    7.5  8.4         Right Ulnar Motor (Abd Dig Min)  30.7C  Wrist    2.7 <4.2 10.2 >3 B Elbow Wrist 3.2 21.0 66 >53  B Elbow    5.9  9.7  A Elbow B Elbow 1.3 0.0  >53  A Elbow    7.2  9.5           Nerve Conduction Studies Anti Sensory Left/Right Comparison   Stim Site L Lat (ms) R Lat (ms) L-R Lat (ms) L Amp (V) R Amp (V) L-R Amp (%) Site1 Site2 L Vel (m/s) R Vel (m/s) L-R Vel (m/s)  Median Acr Palm Anti Sensory (2nd Digit)  32C  Wrist *4.8 *5.0 0.2 25.3 16.8 33.6 Wrist Palm     Palm *2.1 *2.9 0.8 8.1 10.8 25.0       Radial Anti Sensory (Base 1st Digit)  30.5C  Wrist 2.1 2.1 0.0 50.9 31.9 37.3 Wrist Base 1st Digit     Ulnar Anti Sensory (5th Digit)  31.5C  Wrist 3.1 3.2 0.1 40.6  24.1 40.6 Wrist 5th Digit 45 44 1   Motor Left/Right Comparison   Stim Site L Lat (ms) R Lat (ms) L-R Lat (ms) L Amp (mV) R Amp (mV) L-R Amp (%) Site1 Site2  L Vel (m/s) R Vel (m/s) L-R Vel (m/s)  Median Motor (Abd Poll Brev)  30.7C  Wrist 4.0 *4.5 0.5 8.0 *4.1 48.8 Elbow Wrist 51 *49 2  Elbow 8.1 8.9 0.8 2.8 3.3 15.2       Ulnar Motor (Abd Dig Min)  30.7C  Wrist 2.7 2.7 0.0 9.1 10.2 10.8 B Elbow Wrist 62 66 4  B Elbow 6.1 5.9 0.2 8.7 9.7 10.3 A Elbow B Elbow 71    A Elbow 7.5 7.2 0.3 8.4 9.5 11.6             Clinical History: Bilateral upper extremity electrodiagnostic study dated 08/31/2013  Conclusion: This is a limited study as the EMG needle exam was terminated prematurely due to pain. There is moderately-severe right > left bilateral Carpal Tunnel Syndrome. 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 Unable to assess for radiculopathy without more thorough EMG study. No indication of peripheral polyneuropathy.   [name], MD  She reports that she has never smoked. She has never used smokeless tobacco. No results for input(s): HGBA1C, LABURIC in the last 8760 hours.  Objective:  VS:  HT:    WT:   BMI:     BP:   HR: bpm  TEMP: ( )  RESP:  Physical Exam  Musculoskeletal:  Inspection reveals well-healed carpal tunnel release surgical scars and no atrophy of the bilateral APB or FDI or hand intrinsics. There is no swelling, color changes, allodynia or dystrophic changes. There is 5 out of 5 strength in the bilateral wrist extension, finger abduction and long finger flexion. There is intact sensation to light touch in all dermatomal and peripheral nerve distributions. There is a negative Tinel's test at the bilateral wrist and elbow. There is a negative Hoffmann's test bilaterally.    Ortho Exam Imaging: No results found.  Past Medical/Family/Surgical/Social History: Medications & Allergies reviewed per EMR There are no active problems to display for this  patient.  History reviewed. No pertinent past medical history. History reviewed. No pertinent family history. History reviewed. No pertinent surgical history. Social History   Occupational History  . Not on file.   Social History Main Topics  . Smoking status: Never Smoker  . Smokeless tobacco: Never Used  . Alcohol use No  . Drug use: No  . Sexual activity: Not on file

## 2016-07-05 NOTE — Procedures (Signed)
EMG & NCV Findings: Evaluation of the right median motor nerve showed prolonged distal onset latency (4.5 ms), reduced amplitude (4.1 mV), and decreased conduction velocity (Elbow-Wrist, 49 m/s).  The left median (across palm) sensory and the right median (across palm) sensory nerves showed prolonged distal peak latency (Wrist, L4.8, R5.0 ms) and prolonged distal peak latency (Palm, L2.1, R2.9 ms).  All remaining nerves (as indicated in the following tables) were within normal limits.  All left vs. right side differences were within normal limits.    Impression: The above electrodiagnostic study is ABNORMAL and reveals evidence of:  1. A moderate right median nerve entrapment at the wrist affecting sensory and motor components.   2. A mild left median nerve entrapment at the wrist affecting sensory components.   *Both these results show improvement since carpal tunnel release and prior electrodiagnostic study. It's hard to know if this is a recalcitrant a recurrent neuropathy.  There is no significant electrodiagnostic evidence of any other focal nerve entrapment, brachial plexopathy, cervical radiculopathy or generalized peripheral neuropathy. Prior electrodiagnostic report suggested order nerve neuropathy as well but there is no evidence of that on the study.  Recommendations: 1.  Follow-up with referring physician. 2.  Continue current management of symptoms.    Nerve Conduction Studies Anti Sensory Summary Table   Stim Site NR Peak (ms) Norm Peak (ms) P-T Amp (V) Norm P-T Amp Site1 Site2 Delta-P (ms) Dist (cm) Vel (m/s) Norm Vel (m/s)  Left Median Acr Palm Anti Sensory (2nd Digit)  32C  Wrist    *4.8 <3.6 25.3 >10 Wrist Palm 2.7 0.0    Palm    *2.1 <2.0 8.1         Right Median Acr Palm Anti Sensory (2nd Digit)  31.8C  Wrist    *5.0 <3.6 16.8 >10 Wrist Palm 2.1 0.0    Palm    *2.9 <2.0 10.8         Left Radial Anti Sensory (Base 1st Digit)  30.5C  Wrist    2.1 <3.1 50.9  Wrist  Base 1st Digit 2.1 0.0    Right Radial Anti Sensory (Base 1st Digit)  31.3C  Wrist    2.1 <3.1 31.9  Wrist Base 1st Digit 2.1 0.0    Left Ulnar Anti Sensory (5th Digit)  31.5C  Wrist    3.1 <3.7 40.6 >15.0 Wrist 5th Digit 3.1 14.0 45 >38  Right Ulnar Anti Sensory (5th Digit)  31.7C  Wrist    3.2 <3.7 24.1 >15.0 Wrist 5th Digit 3.2 14.0 44 >38   Motor Summary Table   Stim Site NR Onset (ms) Norm Onset (ms) O-P Amp (mV) Norm O-P Amp Site1 Site2 Delta-0 (ms) Dist (cm) Vel (m/s) Norm Vel (m/s)  Left Median Motor (Abd Poll Brev)  30.7C  Wrist    4.0 <4.2 8.0 >5 Elbow Wrist 4.1 21.0 51 >50  Elbow    8.1  2.8         Right Median Motor (Abd Poll Brev)  30.8C  Wrist    *4.5 <4.2 *4.1 >5 Elbow Wrist 4.4 21.5 *49 >50  Elbow    8.9  3.3         Left Ulnar Motor (Abd Dig Min)  30.7C  Wrist    2.7 <4.2 9.1 >3 B Elbow Wrist 3.4 21.0 62 >53  B Elbow    6.1  8.7  A Elbow B Elbow 1.4 10.0 71 >53  A Elbow    7.5  8.4  Right Ulnar Motor (Abd Dig Min)  30.7C  Wrist    2.7 <4.2 10.2 >3 B Elbow Wrist 3.2 21.0 66 >53  B Elbow    5.9  9.7  A Elbow B Elbow 1.3 0.0  >53  A Elbow    7.2  9.5           Nerve Conduction Studies Anti Sensory Left/Right Comparison   Stim Site L Lat (ms) R Lat (ms) L-R Lat (ms) L Amp (V) R Amp (V) L-R Amp (%) Site1 Site2 L Vel (m/s) R Vel (m/s) L-R Vel (m/s)  Median Acr Palm Anti Sensory (2nd Digit)  32C  Wrist *4.8 *5.0 0.2 25.3 16.8 33.6 Wrist Palm     Palm *2.1 *2.9 0.8 8.1 10.8 25.0       Radial Anti Sensory (Base 1st Digit)  30.5C  Wrist 2.1 2.1 0.0 50.9 31.9 37.3 Wrist Base 1st Digit     Ulnar Anti Sensory (5th Digit)  31.5C  Wrist 3.1 3.2 0.1 40.6 24.1 40.6 Wrist 5th Digit 45 44 1   Motor Left/Right Comparison   Stim Site L Lat (ms) R Lat (ms) L-R Lat (ms) L Amp (mV) R Amp (mV) L-R Amp (%) Site1 Site2 L Vel (m/s) R Vel (m/s) L-R Vel (m/s)  Median Motor (Abd Poll Brev)  30.7C  Wrist 4.0 *4.5 0.5 8.0 *4.1 48.8 Elbow Wrist 51 *49 2  Elbow 8.1 8.9  0.8 2.8 3.3 15.2       Ulnar Motor (Abd Dig Min)  30.7C  Wrist 2.7 2.7 0.0 9.1 10.2 10.8 B Elbow Wrist 62 66 4  B Elbow 6.1 5.9 0.2 8.7 9.7 10.3 A Elbow B Elbow 71    A Elbow 7.5 7.2 0.3 8.4 9.5 11.6

## 2016-07-05 NOTE — Progress Notes (Signed)
Both hands are achy and get tired. No numbness or tingling. Worse with driving. Has to shake hands to relieve ache. Right hand dominant.

## 2016-07-17 ENCOUNTER — Ambulatory Visit (INDEPENDENT_AMBULATORY_CARE_PROVIDER_SITE_OTHER): Admitting: Orthopedic Surgery

## 2016-07-17 ENCOUNTER — Encounter (INDEPENDENT_AMBULATORY_CARE_PROVIDER_SITE_OTHER): Payer: Self-pay | Admitting: Orthopedic Surgery

## 2016-07-17 DIAGNOSIS — G5602 Carpal tunnel syndrome, left upper limb: Secondary | ICD-10-CM | POA: Diagnosis not present

## 2016-07-17 DIAGNOSIS — G5603 Carpal tunnel syndrome, bilateral upper limbs: Secondary | ICD-10-CM

## 2016-07-17 DIAGNOSIS — G5601 Carpal tunnel syndrome, right upper limb: Secondary | ICD-10-CM

## 2016-07-17 NOTE — Progress Notes (Signed)
   Office Visit Note   Patient: Krista Miles           Date of Birth: 1955/02/17           MRN: [id number] Visit Date: 07/17/2016 Requested by: Glendale Chard, [address] [place], [place] [address] PCP: Glendale Chard, MD  Subjective: Chief Complaint  Patient presents with  . Follow-up    REVIEW EMG/NCV STUDY    HPI: [name] is a 61 year old female with bilateral wrist pain.  She's had bilateral carpal tunnel releases done in 2016.  She did well and her tingling has improved but now she feels a little heaviness and tiredness in the hands.  She takes Neurontin at night.  She can drive for about 30 minutes and her hand feels tired and heavy.  Her EMG nerve study shows moderate right carpal tunnel and mild left carpal tunnel both of which are improved from her preoperative status but there are does appear to be some recurrent compression which has developed since surgery particularly on the right-hand side.  She is okay with her current work status.              ROS: All systems reviewed are negative as they relate to the chief complaint within the history of present illness.  Patient denies  fevers or chills.   Assessment & Plan: Visit Diagnoses: No diagnosis found.  Plan: Impression is improvement in nerve conduction parameters compared to preoperative studies but there is some recurrence of compression particularly on the right to moderate degree into the left to a mild degree.  Her tingling is gone but some of this heaviness" tiredness" feeling she's having in her hands may be related to the recurrent compression.  At this time I want her to try her brace at night.  She is okay with her current duty level at work.  We will see her back as needed  Follow-Up Instructions: No Follow-up on file.   Orders:  No orders of the defined types were placed in this encounter.  No orders of the defined types were placed in this encounter.     Procedures: No procedures  performed   Clinical Data: No additional findings.  Objective: Vital Signs: There were no vitals taken for this visit.  Physical Exam:   Constitutional: Patient appears well-developed HEENT:  Head: Normocephalic Eyes:EOM are normal Neck: Normal range of motion Cardiovascular: Normal rate Pulmonary/chest: Effort normal Neurologic: Patient is alert Skin: Skin is warm Psychiatric: Patient has normal mood and affect    Ortho Exam: Orthopedic exam demonstrates good grip strength bilaterally with well-healed surgical incisions she has good wrist range of motion with no abductor pollicis brevis wasting on either side.  Radial pulses intact.  Wrist flexion-extension intact.  No other masses lymph and after skin changes noted in the hand region  Specialty Comments:  No specialty comments available.  Imaging: No results found.   PMFS History: There are no active problems to display for this patient.  No past medical history on file.  No family history on file.  No past surgical history on file. Social History   Occupational History  . Not on file.   Social History Main Topics  . Smoking status: Never Smoker  . Smokeless tobacco: Never Used  . Alcohol use No  . Drug use: No  . Sexual activity: Not on file

## 2016-07-18 DIAGNOSIS — K080 Exfoliation of teeth due to systemic causes: Secondary | ICD-10-CM | POA: Diagnosis not present

## 2016-07-31 DIAGNOSIS — K080 Exfoliation of teeth due to systemic causes: Secondary | ICD-10-CM | POA: Diagnosis not present

## 2016-10-01 DIAGNOSIS — Z6833 Body mass index (BMI) 33.0-33.9, adult: Secondary | ICD-10-CM | POA: Diagnosis not present

## 2016-10-01 DIAGNOSIS — E785 Hyperlipidemia, unspecified: Secondary | ICD-10-CM | POA: Diagnosis not present

## 2016-10-01 DIAGNOSIS — R7303 Prediabetes: Secondary | ICD-10-CM | POA: Diagnosis not present

## 2016-10-01 DIAGNOSIS — E559 Vitamin D deficiency, unspecified: Secondary | ICD-10-CM | POA: Diagnosis not present

## 2016-10-07 ENCOUNTER — Other Ambulatory Visit: Payer: Self-pay | Admitting: Obstetrics and Gynecology

## 2016-10-07 DIAGNOSIS — Z1231 Encounter for screening mammogram for malignant neoplasm of breast: Secondary | ICD-10-CM

## 2016-11-19 ENCOUNTER — Ambulatory Visit
Admission: RE | Admit: 2016-11-19 | Discharge: 2016-11-19 | Disposition: A | Discharge status: Home / Self Care | Payer: Federal, State, Local not specified - PPO | Source: Ambulatory Visit | Attending: Obstetrics and Gynecology | Admitting: Obstetrics and Gynecology

## 2016-11-19 DIAGNOSIS — Z1231 Encounter for screening mammogram for malignant neoplasm of breast: Secondary | ICD-10-CM | POA: Diagnosis not present

## 2017-01-08 DIAGNOSIS — R7309 Other abnormal glucose: Secondary | ICD-10-CM | POA: Diagnosis not present

## 2017-01-08 DIAGNOSIS — E785 Hyperlipidemia, unspecified: Secondary | ICD-10-CM | POA: Diagnosis not present

## 2017-01-08 DIAGNOSIS — G5603 Carpal tunnel syndrome, bilateral upper limbs: Secondary | ICD-10-CM | POA: Diagnosis not present

## 2017-01-08 DIAGNOSIS — R222 Localized swelling, mass and lump, trunk: Secondary | ICD-10-CM | POA: Diagnosis not present

## 2017-01-31 DIAGNOSIS — Z6832 Body mass index (BMI) 32.0-32.9, adult: Secondary | ICD-10-CM | POA: Diagnosis not present

## 2017-01-31 DIAGNOSIS — Z1151 Encounter for screening for human papillomavirus (HPV): Secondary | ICD-10-CM | POA: Diagnosis not present

## 2017-01-31 DIAGNOSIS — Z01419 Encounter for gynecological examination (general) (routine) without abnormal findings: Secondary | ICD-10-CM | POA: Diagnosis not present

## 2017-05-01 DIAGNOSIS — M545 Low back pain: Secondary | ICD-10-CM | POA: Diagnosis not present

## 2017-05-01 DIAGNOSIS — R350 Frequency of micturition: Secondary | ICD-10-CM | POA: Diagnosis not present

## 2017-05-01 DIAGNOSIS — Z0000 Encounter for general adult medical examination without abnormal findings: Secondary | ICD-10-CM | POA: Diagnosis not present

## 2017-05-01 LAB — BASIC METABOLIC PANEL
BUN: 9 (ref 4–21)
Creatinine: 0.8 (ref 0.5–1.1)
Glucose: 102
Potassium: 5.3 (ref 3.4–5.3)
Sodium: 141 (ref 137–147)

## 2017-05-01 LAB — HEMOGLOBIN A1C: Hemoglobin A1C: 5.7

## 2017-05-01 LAB — CBC AND DIFFERENTIAL
HCT: 40 (ref 36–46)
Hemoglobin: 12.5 (ref 12.0–16.0)
WBC: 5.6

## 2017-05-01 LAB — VITAMIN D 25 HYDROXY (VIT D DEFICIENCY, FRACTURES): Vit D, 25-Hydroxy: 76.4

## 2017-05-01 LAB — LIPID PANEL
Cholesterol: 199 (ref 0–200)
HDL: 69 (ref 35–70)
LDL Cholesterol: 117
LDl/HDL Ratio: 1.7
Triglycerides: 65 (ref 40–160)

## 2017-05-01 LAB — HEPATIC FUNCTION PANEL
ALT: 16 (ref 7–35)
AST: 16 (ref 13–35)
Alkaline Phosphatase: 88 (ref 25–125)

## 2017-05-15 ENCOUNTER — Encounter (INDEPENDENT_AMBULATORY_CARE_PROVIDER_SITE_OTHER): Payer: Self-pay | Admitting: Orthopedic Surgery

## 2017-05-15 ENCOUNTER — Ambulatory Visit (INDEPENDENT_AMBULATORY_CARE_PROVIDER_SITE_OTHER): Admitting: Orthopedic Surgery

## 2017-05-15 DIAGNOSIS — M79641 Pain in right hand: Secondary | ICD-10-CM

## 2017-05-15 DIAGNOSIS — M79642 Pain in left hand: Secondary | ICD-10-CM

## 2017-05-15 MED ORDER — DICLOFENAC EPOLAMINE 1.3 % TD PTCH: MEDICATED_PATCH | TRANSDERMAL | 0 refills | Status: DC

## 2017-05-18 ENCOUNTER — Encounter (INDEPENDENT_AMBULATORY_CARE_PROVIDER_SITE_OTHER): Payer: Self-pay | Admitting: Orthopedic Surgery

## 2017-05-18 NOTE — Progress Notes (Signed)
Office Visit Note   Patient: Krista Miles           Date of Birth: 14-Dec-1955           MRN: [id number] Visit Date: 05/15/2017 Requested by: Glendale Chard, [address], [place] [address] PCP: Glendale Chard, MD  Subjective: Chief Complaint  Patient presents with  . Right Wrist - Pain    HPI: Patient presents for evaluation of right wrist and hand pain.  States she has to shake her hands at times due to pain.  She wears a brace at night without much relief.  She is right-hand dominant.  She had left carpal tunnel release done in May 2016 and right carpal tunnel release and de Quervain's release done in January 2016.  She does localize her tenderness to the radial styloid on the right-hand side.  Does not really report any numbness and tingling in the right hand.              ROS: All systems reviewed are negative as they relate to the chief complaint within the history of present illness.  Patient denies  fevers or chills.   Assessment & Plan: Visit Diagnoses:  1. Pain in both hands     Plan: Impression is right hand pain with tenderness over the radial styloid.  The tendons do not subluxate with flexion and extension.  Patient does not want an injection.  I think we should try a Flector patch for 2 weeks and see if that helps.  If not we could consider repeat imaging.  No real paresthesias in the dorsal aspect of the hand.  I will see her back in 6 weeks if is not improved  Follow-Up Instructions: Return if symptoms worsen or fail to improve.   Orders:  No orders of the defined types were placed in this encounter.  Meds ordered this encounter  Medications  . diclofenac (FLECTOR) 1.3 % PTCH    Sig: Apply 1 patch every 12 hours for the next 2 weeks.    Dispense:  30 patch    Refill:  0      Procedures: No procedures performed   Clinical Data: No additional findings.  Objective: Vital Signs: There were no vitals taken for this  visit.  Physical Exam:   Constitutional: Patient appears well-developed HEENT:  Head: Normocephalic Eyes:EOM are normal Neck: Normal range of motion Cardiovascular: Normal rate Pulmonary/chest: Effort normal Neurologic: Patient is alert Skin: Skin is warm Psychiatric: Patient has normal mood and affect    Ortho Exam: Orthopedic exam demonstrates good wrist range of motion on the right left-hand side with good grip strength.  No definite paresthesias median radial ulnar distribution.  Well-healed surgical incisions noted in the right wrist and hand.  The first dorsal compartment tendons do not subluxate volarly with wrist flexion.  No pain with pronation supination of the wrist.  No snuffbox tenderness on the right-hand side.  Specialty Comments:  No specialty comments available.  Imaging: No results found.   PMFS History: There are no active problems to display for this patient.  History reviewed. No pertinent past medical history.  History reviewed. No pertinent family history.  History reviewed. No pertinent surgical history. Social History   Occupational History  . Not on file  Tobacco Use  . Smoking status: Never Smoker  . Smokeless tobacco: Never Used  Substance and Sexual Activity  . Alcohol use: No  . Drug use: No  . Sexual  activity: Not on file

## 2017-05-27 ENCOUNTER — Other Ambulatory Visit (INDEPENDENT_AMBULATORY_CARE_PROVIDER_SITE_OTHER): Payer: Self-pay | Admitting: Orthopedic Surgery

## 2017-05-27 NOTE — Telephone Encounter (Signed)
y

## 2017-05-27 NOTE — Telephone Encounter (Signed)
Rx request 

## 2017-06-30 ENCOUNTER — Other Ambulatory Visit (INDEPENDENT_AMBULATORY_CARE_PROVIDER_SITE_OTHER): Payer: Self-pay | Admitting: Orthopedic Surgery

## 2017-06-30 NOTE — Telephone Encounter (Signed)
Ok to rf? 

## 2017-06-30 NOTE — Telephone Encounter (Signed)
y

## 2017-07-01 NOTE — Telephone Encounter (Signed)
y

## 2017-08-06 ENCOUNTER — Ambulatory Visit (INDEPENDENT_AMBULATORY_CARE_PROVIDER_SITE_OTHER)

## 2017-08-06 ENCOUNTER — Ambulatory Visit (INDEPENDENT_AMBULATORY_CARE_PROVIDER_SITE_OTHER): Payer: Self-pay

## 2017-08-06 ENCOUNTER — Encounter (INDEPENDENT_AMBULATORY_CARE_PROVIDER_SITE_OTHER): Payer: Self-pay | Admitting: Orthopedic Surgery

## 2017-08-06 ENCOUNTER — Ambulatory Visit (INDEPENDENT_AMBULATORY_CARE_PROVIDER_SITE_OTHER): Admitting: Orthopedic Surgery

## 2017-08-06 DIAGNOSIS — M65332 Trigger finger, left middle finger: Secondary | ICD-10-CM | POA: Diagnosis not present

## 2017-08-06 DIAGNOSIS — M79642 Pain in left hand: Secondary | ICD-10-CM

## 2017-08-06 DIAGNOSIS — M79641 Pain in right hand: Secondary | ICD-10-CM

## 2017-08-06 NOTE — Progress Notes (Signed)
Office Visit Note   Patient: Krista Miles           Date of Birth: July 03, 1955           MRN: [id number] Visit Date: 08/06/2017 Requested by: Glendale Chard, [address] [place], [place] [address] PCP: Glendale Chard, MD  Subjective: Chief Complaint  Patient presents with  . Hand Pain    HPI: Patient presents with 3-week history of bilateral hand pain left worse than right.  Patient describes triggering of the left third finger and generally a lot of pain emanating from this area.  Denies any history of trauma or injury to that left hand.  The right hand does not have any triggering.  The left hand is more symptomatic.  She tried taking some anti-inflammatories without much relief.  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              ROS: All systems reviewed are negative as they relate to the chief complaint within the history of present illness.  Patient denies  fevers or chills.   Assessment & Plan: Visit Diagnoses:  1. Bilateral hand pain   2. Trigger finger, left middle finger     Plan: Impression is symptomatic left third trigger finger.  Very tender to palpation over the A1 pulley today.  Discussed injections which she does not want to do another conservative measures which she does not want to do.  She would like to proceed with trigger finger release which I think is fairly reasonable at this time based on her prior injury surgeries and responses to those surgeries.  Patient understands the risk and benefits including but not limited to infection nerve vessel damage incomplete pain relief as well as the fact that it may take several weeks to achieve good pain relief.  All questions answered  Follow-Up Instructions: No follow-ups on file.   Orders:  Orders Placed This Encounter  Procedures  . XR Hand Complete Right  . XR Hand Complete Left   No orders of the defined types were placed in this encounter.     Procedures: No procedures performed   Clinical Data: No additional findings.  Objective: Vital Signs: There were no vitals taken for this visit.  Physical Exam:   Constitutional: Patient appears well-developed HEENT:  Head: Normocephalic Eyes:EOM are normal Neck: Normal range of motion Cardiovascular: Normal rate Pulmonary/chest: Effort normal Neurologic: Patient is alert Skin: Skin is warm Psychiatric: Patient has normal mood and affect    Ortho Exam: Ortho exam demonstrates full active and passive range of motion of the hand left and right hand side including the wrist.  No definite paresthesias palmar or dorsal.  Radial pulse intact bilaterally.  No other masses lymph adenopathy or skin changes noted in the hand regions.  Does have tenderness and locking of the left middle finger.  Specialty Comments:  No specialty comments available.  Imaging: Xr Hand Complete Left  Result Date: 08/06/2017 AP lateral oblique left hand reviewed.  No fracture dislocation or arthritis.  Normal left hand  Xr Hand Complete Right  Result Date: 08/06/2017 AP lateral oblique right hand reviewed.  No fracture dislocation or arthritis.  No soft tissue swelling.  Normal right hand    PMFS History: There are no active problems to display for this patient.  History reviewed. No pertinent past medical history.  History reviewed. No pertinent family history.  History reviewed. No pertinent surgical history.  Social History   Occupational History  . Not on file  Tobacco Use  . Smoking status: Never Smoker  . Smokeless tobacco: Never Used  Substance and Sexual Activity  . Alcohol use: No  . Drug use: No  . Sexual activity: Not on file       

## 2017-09-15 ENCOUNTER — Telehealth (INDEPENDENT_AMBULATORY_CARE_PROVIDER_SITE_OTHER): Payer: Self-pay | Admitting: Orthopedic Surgery

## 2017-09-15 NOTE — Telephone Encounter (Signed)
Patient called asked for a call back concerning when can she be scheduled for surgery. Patient said she was approved thru [employer]. The number to contact patient is [phone]

## 2017-09-29 DIAGNOSIS — M65332 Trigger finger, left middle finger: Secondary | ICD-10-CM

## 2017-10-04 HISTORY — PX: TRIGGER FINGER RELEASE: SHX641

## 2017-10-06 ENCOUNTER — Encounter (INDEPENDENT_AMBULATORY_CARE_PROVIDER_SITE_OTHER): Payer: Self-pay | Admitting: Orthopedic Surgery

## 2017-10-06 ENCOUNTER — Ambulatory Visit (INDEPENDENT_AMBULATORY_CARE_PROVIDER_SITE_OTHER): Admitting: Orthopedic Surgery

## 2017-10-06 DIAGNOSIS — M79642 Pain in left hand: Secondary | ICD-10-CM

## 2017-10-06 DIAGNOSIS — M79641 Pain in right hand: Secondary | ICD-10-CM

## 2017-10-08 ENCOUNTER — Encounter (INDEPENDENT_AMBULATORY_CARE_PROVIDER_SITE_OTHER): Payer: Self-pay | Admitting: Orthopedic Surgery

## 2017-10-08 NOTE — Progress Notes (Signed)
   Post-Op Visit Note   Patient: Krista Miles           Date of Birth: 05-04-1955           MRN: [id number] Visit Date: 10/06/2017 PCP: Glendale Chard, MD   Assessment & Plan:  Chief Complaint:  Chief Complaint  Patient presents with  . Left Hand - Routine Post Op   Visit Diagnoses:  1. Bilateral hand pain     Plan: Patient presents now a week out left long trigger finger release.  Patient's been doing reasonably well.  Taking Norco for pain.  On exam the fingers little stiff but it does not trigger.  She states that her right second and fourth digit are giving her pain and locking as well.  Plan is to return to clinic on Thursday for suture removal and to really start working on range of motion exercises at that time.  Follow-Up Instructions: No follow-ups on file.   Orders:  No orders of the defined types were placed in this encounter.  No orders of the defined types were placed in this encounter.   Imaging: No results found.  PMFS History: There are no active problems to display for this patient.  History reviewed. No pertinent past medical history.  History reviewed. No pertinent family history.  History reviewed. No pertinent surgical history. Social History   Occupational History  . Not on file  Tobacco Use  . Smoking status: Never Smoker  . Smokeless tobacco: Never Used  Substance and Sexual Activity  . Alcohol use: No  . Drug use: No  . Sexual activity: Not on file

## 2017-10-09 ENCOUNTER — Encounter (INDEPENDENT_AMBULATORY_CARE_PROVIDER_SITE_OTHER): Payer: Self-pay | Admitting: Orthopedic Surgery

## 2017-10-09 ENCOUNTER — Ambulatory Visit (INDEPENDENT_AMBULATORY_CARE_PROVIDER_SITE_OTHER): Admitting: Orthopedic Surgery

## 2017-10-09 DIAGNOSIS — M65332 Trigger finger, left middle finger: Secondary | ICD-10-CM

## 2017-10-10 ENCOUNTER — Encounter (INDEPENDENT_AMBULATORY_CARE_PROVIDER_SITE_OTHER): Payer: Self-pay | Admitting: Orthopedic Surgery

## 2017-10-10 NOTE — Progress Notes (Signed)
   Post-Op Visit Note   Patient: OLUWATOYIN BANALES           Date of Birth: 29-Jun-1955           MRN: [id number] Visit Date: 10/09/2017 PCP: Glendale Chard, MD   Assessment & Plan:  Chief Complaint:  Chief Complaint  Patient presents with  . Follow-up   Visit Diagnoses:  1. Trigger finger, left middle finger     Plan: Patient presents for suture removal status post left trigger finger release of the long finger 09/29/2017.  She is been doing well.  Sutures are removed today.  No evidence of any infection.  On her to work on finger range of motion exercises.  I think this should heal up like the others have.  I will see her back as needed.  I did caution her against extension of that finger for the next 3 to 4 weeks just to prevent dehiscence of the incision.  Follow-Up Instructions: Return if symptoms worsen or fail to improve.   Orders:  No orders of the defined types were placed in this encounter.  No orders of the defined types were placed in this encounter.   Imaging: No results found.  PMFS History: There are no active problems to display for this patient.  History reviewed. No pertinent past medical history.  History reviewed. No pertinent family history.  History reviewed. No pertinent surgical history. Social History   Occupational History  . Not on file  Tobacco Use  . Smoking status: Never Smoker  . Smokeless tobacco: Never Used  Substance and Sexual Activity  . Alcohol use: No  . Drug use: No  . Sexual activity: Not on file

## 2017-10-15 ENCOUNTER — Other Ambulatory Visit: Payer: Self-pay | Admitting: Obstetrics and Gynecology

## 2017-10-15 DIAGNOSIS — Z1231 Encounter for screening mammogram for malignant neoplasm of breast: Secondary | ICD-10-CM

## 2017-10-16 ENCOUNTER — Telehealth (INDEPENDENT_AMBULATORY_CARE_PROVIDER_SITE_OTHER): Payer: Self-pay | Admitting: Orthopedic Surgery

## 2017-10-16 NOTE — Telephone Encounter (Signed)
IC advised per Dr Marlou Sa

## 2017-10-16 NOTE — Telephone Encounter (Signed)
Please advise. Thanks.  

## 2017-10-16 NOTE — Telephone Encounter (Signed)
Wait til 4 w post op

## 2017-10-16 NOTE — Telephone Encounter (Signed)
Patient called asked if she can put vitamin e capsules or cream on her incision? The number to contact patient is [phone]

## 2017-10-28 ENCOUNTER — Encounter: Payer: Self-pay | Admitting: Internal Medicine

## 2017-10-28 DIAGNOSIS — E785 Hyperlipidemia, unspecified: Secondary | ICD-10-CM | POA: Insufficient documentation

## 2017-10-28 DIAGNOSIS — R7309 Other abnormal glucose: Secondary | ICD-10-CM | POA: Insufficient documentation

## 2017-10-30 ENCOUNTER — Ambulatory Visit: Payer: Self-pay | Admitting: Internal Medicine

## 2017-11-01 ENCOUNTER — Other Ambulatory Visit: Payer: Self-pay | Admitting: Internal Medicine

## 2017-11-03 NOTE — Telephone Encounter (Signed)
Cyclobenazaprine refill

## 2017-11-12 ENCOUNTER — Encounter: Payer: Self-pay | Admitting: Internal Medicine

## 2017-11-12 ENCOUNTER — Ambulatory Visit: Payer: Federal, State, Local not specified - PPO | Admitting: Internal Medicine

## 2017-11-12 VITALS — BP 112/70 | HR 87 | Temp 98.2°F | Ht 61.5 in | Wt 184.0 lb

## 2017-11-12 DIAGNOSIS — E7800 Pure hypercholesterolemia, unspecified: Secondary | ICD-10-CM

## 2017-11-12 DIAGNOSIS — Z79899 Other long term (current) drug therapy: Secondary | ICD-10-CM

## 2017-11-12 DIAGNOSIS — Z712 Person consulting for explanation of examination or test findings: Secondary | ICD-10-CM

## 2017-11-12 DIAGNOSIS — R7309 Other abnormal glucose: Secondary | ICD-10-CM

## 2017-11-12 DIAGNOSIS — M545 Low back pain, unspecified: Secondary | ICD-10-CM

## 2017-11-12 DIAGNOSIS — R350 Frequency of micturition: Secondary | ICD-10-CM

## 2017-11-12 DIAGNOSIS — Z9114 Patient's other noncompliance with medication regimen: Secondary | ICD-10-CM

## 2017-11-12 LAB — POCT URINALYSIS DIPSTICK
Bilirubin, UA: NEGATIVE
Blood, UA: NEGATIVE
Glucose, UA: NEGATIVE
Ketones, UA: NEGATIVE
Leukocytes, UA: NEGATIVE
Nitrite, UA: NEGATIVE
Protein, UA: NEGATIVE
Spec Grav, UA: 1.01 (ref 1.010–1.025)
Urobilinogen, UA: 0.2 E.U./dL
pH, UA: 7 (ref 5.0–8.0)

## 2017-11-12 MED ORDER — TRAMADOL HCL 50 MG PO TABS: 50.0000 mg | ORAL_TABLET | Freq: Four times a day (QID) | ORAL | 0 refills | Status: DC | PRN

## 2017-11-13 ENCOUNTER — Other Ambulatory Visit: Payer: Self-pay | Admitting: Nurse Practitioner

## 2017-11-13 MED ORDER — NITROFURANTOIN MONOHYD MACRO 100 MG PO CAPS: 100.0000 mg | ORAL_CAPSULE | Freq: Two times a day (BID) | ORAL | 0 refills | Status: AC

## 2017-11-18 NOTE — Progress Notes (Signed)
pls call to check on pt - how is she feeling? Urine was negative. Wanted to make sure she is feeling better.

## 2017-11-20 ENCOUNTER — Ambulatory Visit: Payer: Federal, State, Local not specified - PPO

## 2017-11-25 ENCOUNTER — Other Ambulatory Visit: Payer: Self-pay | Admitting: Internal Medicine

## 2017-11-25 DIAGNOSIS — M545 Low back pain, unspecified: Secondary | ICD-10-CM

## 2017-11-25 NOTE — Progress Notes (Signed)
How is she feeling now? I have put in order for xray

## 2017-12-07 DIAGNOSIS — M545 Low back pain, unspecified: Secondary | ICD-10-CM | POA: Insufficient documentation

## 2017-12-07 DIAGNOSIS — R350 Frequency of micturition: Secondary | ICD-10-CM | POA: Insufficient documentation

## 2017-12-07 NOTE — Progress Notes (Signed)
Subjective:     Patient ID: Krista Miles , female    DOB: 12-19-1955 , 62 y.o.   MRN: [id number]   Chief Complaint  Patient presents with  . Follow-up    medication   . Back Pain    for 4 days     HPI  She is here today for high cholesterol f/u. However, she admits that she has not been taking meds. She admits that she did not go to pharmacy to pick up the prescription.   Back Pain  This is a recurrent problem. The current episode started 1 to 4 weeks ago. The problem occurs 2 to 4 times per day. The problem has been gradually worsening since onset. The quality of the pain is described as aching. The pain does not radiate. The pain is at a severity of 6/10. The pain is moderate.   She denies lower extremity weakness and paresthesias.  Past Medical History:  Diagnosis Date  . Hyperlipidemia      Family History  Problem Relation Age of Onset  . Cancer Father   . Cancer Maternal Aunt      Current Outpatient Medications:  .  cyclobenzaprine (FLEXERIL) 10 MG tablet, Take 10 mg by mouth 3 (three) times daily as needed for muscle spasms., Disp: , Rfl:  .  gabapentin (NEURONTIN) 100 MG capsule, Take 100 mg by mouth 3 (three) times daily., Disp: , Rfl:  .  naproxen sodium (ANAPROX) 220 MG tablet, Take 220 mg by mouth 2 (two) times daily with a meal., Disp: , Rfl:  .  pravastatin (PRAVACHOL) 40 MG tablet, Take 40 mg by mouth daily., Disp: , Rfl:  .  traMADol (ULTRAM) 50 MG tablet, Take 1 tablet (50 mg total) by mouth every 6 (six) hours as needed., Disp: 20 tablet, Rfl: 0 .  Vitamin D, Ergocalciferol, (DRISDOL) 1.25 MG (50000 UT) CAPS capsule, TAKE 1 CAPSULE BY ORAL ROUTE EVERY WEEK ON TUESDAYS AND THURSDAYS, Disp: 24 capsule, Rfl: 0   No Known Allergies   Review of Systems  Constitutional: Negative.   Respiratory: Negative.   Cardiovascular: Negative.   Genitourinary: Positive for frequency.  Musculoskeletal: Positive for back pain.  Neurological: Negative.    Psychiatric/Behavioral: Negative.      Today's Vitals   11/12/17 1535  BP: 112/70  Pulse: 87  Temp: 98.2 F (36.8 C)  TempSrc: Oral  SpO2: 96%  Weight: 184 lb (83.5 kg)  Height: 5' 1.5" (1.562 m)   Body mass index is 34.2 kg/m.   Objective:  Physical Exam  Constitutional: She is oriented to person, place, and time. She appears well-developed and well-nourished.  HENT:  Head: Normocephalic and atraumatic.  Eyes: EOM are normal.  Cardiovascular: Normal rate, regular rhythm and normal heart sounds.  Pulmonary/Chest: Effort normal and breath sounds normal.  Musculoskeletal:       Lumbar back: She exhibits tenderness and spasm.  Neurological: She is alert and oriented to person, place, and time.  Psychiatric: She has a normal mood and affect.  Nursing note and vitals reviewed.       Assessment And Plan:     1. Pure hypercholesterolemia  Recent lab results reviewed in full detail. She is encouraged to pick up the prescription from the pharmacy. I will check lipid panel at her next visit in six weeks. She is encouraged to avoid fried foods, take meds as prescribed and incorporate more exercise into her daily routine.   2. Acute left-sided low back pain without  sciatica  She was given rx cyclobenzaprine to use nightly as needed. I will check x-ray if her sx persist/worsen.   3. Other abnormal glucose  I will check an a1c at her next visit. She does not wish to have bloodwork today. She is encouraged to avoid sugary beverages, including diet drinks.   4. Urinary frequency  Urinalysis performed.   5. Drug therapy   Maximino Greenland, MD

## 2017-12-08 DIAGNOSIS — M5386 Other specified dorsopathies, lumbar region: Secondary | ICD-10-CM | POA: Diagnosis not present

## 2017-12-08 DIAGNOSIS — M9905 Segmental and somatic dysfunction of pelvic region: Secondary | ICD-10-CM | POA: Diagnosis not present

## 2017-12-08 DIAGNOSIS — M9902 Segmental and somatic dysfunction of thoracic region: Secondary | ICD-10-CM | POA: Diagnosis not present

## 2017-12-08 DIAGNOSIS — M9903 Segmental and somatic dysfunction of lumbar region: Secondary | ICD-10-CM | POA: Diagnosis not present

## 2017-12-23 ENCOUNTER — Other Ambulatory Visit: Payer: Self-pay | Admitting: Internal Medicine

## 2017-12-24 DIAGNOSIS — M9905 Segmental and somatic dysfunction of pelvic region: Secondary | ICD-10-CM | POA: Diagnosis not present

## 2017-12-24 DIAGNOSIS — M9903 Segmental and somatic dysfunction of lumbar region: Secondary | ICD-10-CM | POA: Diagnosis not present

## 2017-12-24 DIAGNOSIS — M5386 Other specified dorsopathies, lumbar region: Secondary | ICD-10-CM | POA: Diagnosis not present

## 2017-12-24 DIAGNOSIS — M9902 Segmental and somatic dysfunction of thoracic region: Secondary | ICD-10-CM | POA: Diagnosis not present

## 2018-01-05 ENCOUNTER — Ambulatory Visit
Admission: RE | Admit: 2018-01-05 | Discharge: 2018-01-05 | Disposition: A | Discharge status: Home / Self Care | Payer: Federal, State, Local not specified - PPO | Source: Ambulatory Visit | Attending: Obstetrics and Gynecology | Admitting: Obstetrics and Gynecology

## 2018-01-05 DIAGNOSIS — Z1231 Encounter for screening mammogram for malignant neoplasm of breast: Secondary | ICD-10-CM | POA: Diagnosis not present

## 2018-01-12 ENCOUNTER — Ambulatory Visit (INDEPENDENT_AMBULATORY_CARE_PROVIDER_SITE_OTHER): Admitting: Orthopedic Surgery

## 2018-01-12 DIAGNOSIS — M6530 Trigger finger, unspecified finger: Secondary | ICD-10-CM | POA: Diagnosis not present

## 2018-01-13 ENCOUNTER — Ambulatory Visit
Admission: RE | Admit: 2018-01-13 | Discharge: 2018-01-13 | Disposition: A | Discharge status: Home / Self Care | Payer: Federal, State, Local not specified - PPO | Source: Ambulatory Visit | Attending: Internal Medicine | Admitting: Internal Medicine

## 2018-01-13 ENCOUNTER — Ambulatory Visit: Payer: Federal, State, Local not specified - PPO | Admitting: Internal Medicine

## 2018-01-13 ENCOUNTER — Encounter: Payer: Self-pay | Admitting: Internal Medicine

## 2018-01-13 VITALS — BP 112/78 | HR 76 | Temp 98.1°F | Ht 61.0 in | Wt 182.4 lb

## 2018-01-13 DIAGNOSIS — H6121 Impacted cerumen, right ear: Secondary | ICD-10-CM | POA: Diagnosis not present

## 2018-01-13 DIAGNOSIS — R059 Cough, unspecified: Secondary | ICD-10-CM

## 2018-01-13 DIAGNOSIS — R05 Cough: Secondary | ICD-10-CM

## 2018-01-13 DIAGNOSIS — Z111 Encounter for screening for respiratory tuberculosis: Secondary | ICD-10-CM

## 2018-01-13 MED ORDER — BENZONATATE 100 MG PO CAPS: 200.0000 mg | ORAL_CAPSULE | Freq: Three times a day (TID) | ORAL | 0 refills | Status: DC

## 2018-01-13 MED ORDER — TUBERCULIN PPD 5 UNIT/0.1ML ID SOLN
5.0000 [IU] | Freq: Once | INTRADERMAL | Status: AC
  Administered 2018-01-13: 5 [IU] via INTRADERMAL

## 2018-01-13 NOTE — Progress Notes (Signed)
Subjective:     Patient ID: Krista Miles , female    DOB: [birth year] , 63 y.o.   MRN: [id number]   Chief Complaint  Patient presents with  . Cough    Patient states she has been having a dry cough since she went to [place] back during thanksgiving time    HPI Has been haiving a non productive cough x 6 weeks since she returned from [place].  Feels a tickle on her throat and been taking Rob Dm. Does not keep her up at night since she takes the Robitusin. Cough is provoked with clear mucous Denies rhinitis, PND, fever, chills or sweats.    Past Medical History:  Diagnosis Date  . Hyperlipidemia      Family History  Problem Relation Age of Onset  . Cancer Father   . Cancer Maternal Aunt      Current Outpatient Medications:  .  cyclobenzaprine (FLEXERIL) 10 MG tablet, Take 10 mg by mouth 3 (three) times daily as needed for muscle spasms., Disp: , Rfl:  .  gabapentin (NEURONTIN) 300 MG capsule, TAKE ONE CAPSULE BY MOUTH TWICE A DAY (Patient taking differently: PATIENT TAKING ONCE DAILY), Disp: 180 capsule, Rfl: 1 .  pravastatin (PRAVACHOL) 40 MG tablet, Take 40 mg by mouth daily. PATIENT TAKING AS NEEDED, Disp: , Rfl:  .  Vitamin D, Ergocalciferol, (DRISDOL) 1.25 MG (50000 UT) CAPS capsule, TAKE 1 CAPSULE BY ORAL ROUTE EVERY WEEK ON TUESDAYS AND THURSDAYS, Disp: 24 capsule, Rfl: 0   No Known Allergies   Review of Systems  Constitutional: Negative for chills, diaphoresis and fever.  HENT: Negative for ear discharge, ear pain, postnasal drip, rhinorrhea and sore throat.   Respiratory: Positive for cough. Negative for chest tightness, shortness of breath and wheezing.   Cardiovascular: Negative for chest pain and leg swelling.  Musculoskeletal: Negative for myalgias.  Skin: Negative for rash.  Allergic/Immunologic: Negative for environmental allergies.  Neurological: Negative for dizziness and headaches.  Hematological: Negative for adenopathy.  Psychiatric/Behavioral:  Negative for sleep disturbance.    Today's Vitals   01/13/18 1109  BP: 112/78  Pulse: 76  Temp: 98.1 F (36.7 C)  TempSrc: Oral  Weight: 182 lb 6.4 oz (82.7 kg)  Height: 5\' 1"  (1.549 m)  PainSc: 0-No pain   Body mass index is 34.46 kg/m.   Objective:  Physical Exam Vitals signs and nursing note reviewed.  Constitutional:      General: She is not in acute distress.    Appearance: Normal appearance. She is not toxic-appearing.  HENT:     Head: Normocephalic.     Right Ear: External ear normal. There is impacted cerumen.     Left Ear: Tympanic membrane and external ear normal.     Ears:     Comments: R TM is not visible due to moderate wax, there is also moderate wax in the L but upper TM is visible which is gray.     Nose: Nose normal. No congestion or rhinorrhea.     Mouth/Throat:     Mouth: Mucous membranes are moist.     Pharynx: Oropharynx is clear. No oropharyngeal exudate or posterior oropharyngeal erythema.  Eyes:     General: No scleral icterus.    Conjunctiva/sclera: Conjunctivae normal.  Cardiovascular:     Rate and Rhythm: Normal rate and regular rhythm.     Heart sounds: No murmur.  Pulmonary:     Effort: Pulmonary effort is normal. No respiratory distress.  Breath sounds: No wheezing, rhonchi or rales.  Musculoskeletal: Normal range of motion.  Skin:    General: Skin is warm and dry.     Findings: No rash.  Neurological:     Mental Status: She is alert and oriented to person, place, and time.     Gait: Gait normal.  Psychiatric:        Mood and Affect: Mood normal.        Behavior: Behavior normal.        Thought Content: Thought content normal.        Judgment: Judgment normal.    Assessment And Plan:    1. Cough- could be from cerumen impacted against the TM or PND - tuberculin injection 5 Units- given today - DG Chest 2 View; Future I will have her try Tessalon Perless as noted on rx. We will inform her about results when she comes back.   2- Cerumen Impaction- acute, she was asked to soak her ears with debrox qhs til she comes back in  7-10 days for ear lavage.     Kerianne Gurr RODRIGUEZ-SOUTHWORTH, PA-C

## 2018-01-13 NOTE — Patient Instructions (Signed)
Get Debrox ear drops and use them every night til you come back for ear wash.   Get the chest xray this week and we will inform you of the results when it is back.

## 2018-01-14 ENCOUNTER — Telehealth: Payer: Self-pay

## 2018-01-14 NOTE — Progress Notes (Signed)
   Office Visit Note   Patient: Krista Miles           Date of Birth: 11/10/1955           MRN: [id number] Visit Date: 01/12/2018 Requested by: Glendale Chard, [address] [place], [place] [address] PCP: Glendale Chard, MD  Subjective: Chief Complaint  Patient presents with  . Right Hand - Pain    HPI: Patient presents with right hand trigger finger of the fourth finger.  She is right-hand dominant.  She is had other trigger fingers release with good results.  She has not had good luck with injections.  Takes Aleve and gabapentin.  She works in Systems developer.  She is right-hand dominant.              ROS: All systems reviewed are negative as they relate to the chief complaint within the history of present illness.  Patient denies  fevers or chills.   Assessment & Plan: Visit Diagnoses:  1. Trigger finger of right hand, unspecified finger     Plan: Impression is right handed fourth trigger finger.  Plan is right fourth trigger finger release.  Risks and benefits are discussed.  All questions answered.  Anticipate several weeks out of work.  Follow-Up Instructions: No follow-ups on file.   Orders:  No orders of the defined types were placed in this encounter.  No orders of the defined types were placed in this encounter.     Procedures: No procedures performed   Clinical Data: No additional findings.  Objective: Vital Signs: There were no vitals taken for this visit.  Physical Exam:   Constitutional: Patient appears well-developed HEENT:  Head: Normocephalic Eyes:EOM are normal Neck: Normal range of motion Cardiovascular: Normal rate Pulmonary/chest: Effort normal Neurologic: Patient is alert Skin: Skin is warm Psychiatric: Patient has normal mood and affect    Ortho Exam: Ortho exam demonstrates good grip strength.  Does have tenderness and triggering over the A1 pulley of the right fourth finger.  Wrist range of motion  otherwise normal.  Radial pulses intact.  Skin is intact on the palm.  Specialty Comments:  No specialty comments available.  Imaging: No results found.   PMFS History: Patient Active Problem List   Diagnosis Date Noted  . Acute left-sided low back pain without sciatica 12/07/2017  . Urinary frequency 12/07/2017  . Hyperlipidemia 10/28/2017  . Abnormal glucose 10/28/2017   Past Medical History:  Diagnosis Date  . Hyperlipidemia     Family History  Problem Relation Age of Onset  . Cancer Father   . Cancer Maternal Aunt     Past Surgical History:  Procedure Laterality Date  . CESAREAN SECTION    . HAND SURGERY Bilateral 2016  . TRIGGER FINGER RELEASE Left 10/04/2017   Dr. Alphonzo Severance   Social History   Occupational History  . Not on file  Tobacco Use  . Smoking status: Never Smoker  . Smokeless tobacco: Never Used  Substance and Sexual Activity  . Alcohol use: No  . Drug use: No  . Sexual activity: Not on file

## 2018-01-14 NOTE — Telephone Encounter (Signed)
-----   Message from Shelby Mattocks, [place] sent at 01/13/2018  3:23 PM EST ----- Please inform her that her chest xray is neg

## 2018-01-17 ENCOUNTER — Encounter (INDEPENDENT_AMBULATORY_CARE_PROVIDER_SITE_OTHER): Payer: Self-pay | Admitting: Orthopedic Surgery

## 2018-01-22 ENCOUNTER — Ambulatory Visit: Payer: Self-pay | Admitting: Internal Medicine

## 2018-01-29 ENCOUNTER — Ambulatory Visit: Payer: Self-pay | Admitting: Internal Medicine

## 2018-02-17 DIAGNOSIS — M9903 Segmental and somatic dysfunction of lumbar region: Secondary | ICD-10-CM | POA: Diagnosis not present

## 2018-02-17 DIAGNOSIS — M9902 Segmental and somatic dysfunction of thoracic region: Secondary | ICD-10-CM | POA: Diagnosis not present

## 2018-02-17 DIAGNOSIS — M5386 Other specified dorsopathies, lumbar region: Secondary | ICD-10-CM | POA: Diagnosis not present

## 2018-02-17 DIAGNOSIS — M9905 Segmental and somatic dysfunction of pelvic region: Secondary | ICD-10-CM | POA: Diagnosis not present

## 2018-02-20 ENCOUNTER — Other Ambulatory Visit: Payer: Self-pay | Admitting: Internal Medicine

## 2018-03-18 DIAGNOSIS — J069 Acute upper respiratory infection, unspecified: Secondary | ICD-10-CM | POA: Diagnosis not present

## 2018-03-23 ENCOUNTER — Encounter: Payer: Self-pay | Admitting: Orthopedic Surgery

## 2018-03-23 DIAGNOSIS — M65341 Trigger finger, right ring finger: Secondary | ICD-10-CM | POA: Diagnosis not present

## 2018-03-27 ENCOUNTER — Telehealth (INDEPENDENT_AMBULATORY_CARE_PROVIDER_SITE_OTHER): Payer: Self-pay | Admitting: Orthopedic Surgery

## 2018-03-27 NOTE — Telephone Encounter (Signed)
Patient left a message wanting someone to call her back.  CB#[phone].  Thank you.

## 2018-03-27 NOTE — Telephone Encounter (Signed)
I called. Patient wanted to know when should come in post op Scheduled for 03/27

## 2018-04-02 ENCOUNTER — Telehealth (INDEPENDENT_AMBULATORY_CARE_PROVIDER_SITE_OTHER): Payer: Self-pay | Admitting: Radiology

## 2018-04-02 NOTE — Telephone Encounter (Signed)
Called and asked patient COVID-19 screening questions. Patient answered NO to all questions.

## 2018-04-03 ENCOUNTER — Other Ambulatory Visit: Payer: Self-pay

## 2018-04-03 ENCOUNTER — Encounter (INDEPENDENT_AMBULATORY_CARE_PROVIDER_SITE_OTHER): Payer: Self-pay | Admitting: Orthopedic Surgery

## 2018-04-03 ENCOUNTER — Ambulatory Visit (INDEPENDENT_AMBULATORY_CARE_PROVIDER_SITE_OTHER): Admitting: Orthopedic Surgery

## 2018-04-03 DIAGNOSIS — M6530 Trigger finger, unspecified finger: Secondary | ICD-10-CM

## 2018-04-03 NOTE — Progress Notes (Signed)
   Post-Op Visit Note   Patient: Krista Miles           Date of Birth: 31-Dec-1955           MRN: [id number] Visit Date: 04/03/2018 PCP: Glendale Chard, MD   Assessment & Plan:  Chief Complaint:  Chief Complaint  Patient presents with  . Right Hand - Routine Post Op   Visit Diagnoses:  1. Trigger finger of right hand, unspecified finger     Plan: Patient presents now 11 days out right trigger finger release #4.  She has been doing well.  On exam she has good range of motion with no triggering.  Incision is intact and sutures are removed.  I want her to be somewhat careful for the next 10 to 12 days with lifting.  I will see her back as needed  Follow-Up Instructions: Return if symptoms worsen or fail to improve.   Orders:  No orders of the defined types were placed in this encounter.  No orders of the defined types were placed in this encounter.   Imaging: No results found.  PMFS History: Patient Active Problem List   Diagnosis Date Noted  . Acute left-sided low back pain without sciatica 12/07/2017  . Urinary frequency 12/07/2017  . Hyperlipidemia 10/28/2017  . Abnormal glucose 10/28/2017   Past Medical History:  Diagnosis Date  . Hyperlipidemia     Family History  Problem Relation Age of Onset  . Cancer Father   . Cancer Maternal Aunt     Past Surgical History:  Procedure Laterality Date  . CESAREAN SECTION    . HAND SURGERY Bilateral 2016  . TRIGGER FINGER RELEASE Left 10/04/2017   Dr. Alphonzo Severance   Social History   Occupational History  . Not on file  Tobacco Use  . Smoking status: Never Smoker  . Smokeless tobacco: Never Used  Substance and Sexual Activity  . Alcohol use: No  . Drug use: No  . Sexual activity: Not on file

## 2018-04-06 ENCOUNTER — Telehealth (INDEPENDENT_AMBULATORY_CARE_PROVIDER_SITE_OTHER): Payer: Self-pay

## 2018-04-06 ENCOUNTER — Inpatient Hospital Stay (INDEPENDENT_AMBULATORY_CARE_PROVIDER_SITE_OTHER): Payer: Self-pay | Admitting: Orthopedic Surgery

## 2018-04-06 NOTE — Telephone Encounter (Signed)
I tried calling patient, in regards to [facility] form that she dropped off after her appt with Dr Marlou Sa on Friday. She did not answer. LM for her to [facility] to discuss.

## 2018-05-06 HISTORY — PX: TRIGGER FINGER RELEASE: SHX641

## 2018-05-21 ENCOUNTER — Other Ambulatory Visit: Payer: Self-pay | Admitting: Internal Medicine

## 2018-05-28 ENCOUNTER — Telehealth: Payer: Self-pay | Admitting: Internal Medicine

## 2018-05-28 NOTE — Telephone Encounter (Signed)
Called patient to confirm appt no ans and vm full sent sms notification by vm

## 2018-06-02 ENCOUNTER — Ambulatory Visit: Payer: Federal, State, Local not specified - PPO | Admitting: Internal Medicine

## 2018-06-02 ENCOUNTER — Encounter: Payer: Self-pay | Admitting: Internal Medicine

## 2018-06-02 ENCOUNTER — Other Ambulatory Visit: Payer: Self-pay

## 2018-06-02 VITALS — BP 112/66 | HR 91 | Temp 98.7°F | Ht 61.8 in | Wt 183.8 lb

## 2018-06-02 DIAGNOSIS — Z6833 Body mass index (BMI) 33.0-33.9, adult: Secondary | ICD-10-CM

## 2018-06-02 DIAGNOSIS — Z0000 Encounter for general adult medical examination without abnormal findings: Secondary | ICD-10-CM | POA: Diagnosis not present

## 2018-06-02 DIAGNOSIS — M545 Low back pain, unspecified: Secondary | ICD-10-CM

## 2018-06-02 DIAGNOSIS — E6609 Other obesity due to excess calories: Secondary | ICD-10-CM

## 2018-06-02 DIAGNOSIS — D171 Benign lipomatous neoplasm of skin and subcutaneous tissue of trunk: Secondary | ICD-10-CM

## 2018-06-02 DIAGNOSIS — L2389 Allergic contact dermatitis due to other agents: Secondary | ICD-10-CM | POA: Diagnosis not present

## 2018-06-02 DIAGNOSIS — G8929 Other chronic pain: Secondary | ICD-10-CM

## 2018-06-02 LAB — POCT URINALYSIS DIPSTICK
Bilirubin, UA: NEGATIVE
Blood, UA: NEGATIVE
Glucose, UA: NEGATIVE
Ketones, UA: NEGATIVE
Nitrite, UA: NEGATIVE
Protein, UA: NEGATIVE
Spec Grav, UA: 1.025 (ref 1.010–1.025)
Urobilinogen, UA: 0.2 E.U./dL
pH, UA: 5.5 (ref 5.0–8.0)

## 2018-06-02 MED ORDER — CYCLOBENZAPRINE HCL 10 MG PO TABS: 10.0000 mg | ORAL_TABLET | Freq: Two times a day (BID) | ORAL | 0 refills | Status: DC | PRN

## 2018-06-02 MED ORDER — TRIAMCINOLONE ACETONIDE 0.1 % EX CREA: 1.0000 "application " | TOPICAL_CREAM | Freq: Two times a day (BID) | CUTANEOUS | 0 refills | Status: DC | PRN

## 2018-06-02 MED ORDER — PRAVASTATIN SODIUM 40 MG PO TABS: 40.0000 mg | ORAL_TABLET | Freq: Every day | ORAL | 1 refills | Status: DC

## 2018-06-02 NOTE — Patient Instructions (Signed)

## 2018-06-02 NOTE — Progress Notes (Signed)
Subjective:     Patient ID: Krista Miles , female    DOB: Nov 23, 1955 , 63 y.o.   MRN: [id number]   Chief Complaint  Patient presents with  . Annual Exam    HPI  She is here today for a full physical examination.  She is followed by Dr. Garwin Brothers for her pelvic exams. She was last seen in 2019. She has no specific concerns or complaints at this time.     Past Medical History:  Diagnosis Date  . Hyperlipidemia      Family History  Problem Relation Age of Onset  . Dementia Mother   . Cancer Father   . Leukemia Father   . Cancer Maternal Aunt      Current Outpatient Medications:  .  gabapentin (NEURONTIN) 300 MG capsule, TAKE ONE CAPSULE BY MOUTH TWICE A DAY (Patient taking differently: PATIENT TAKING ONCE DAILY), Disp: 180 capsule, Rfl: 1 .  Vitamin D, Ergocalciferol, (DRISDOL) 1.25 MG (50000 UT) CAPS capsule, TAKE 1 CAPSULE EVERY WEEK ON TUESDAYS AND THURSDAYS, Disp: 24 capsule, Rfl: 0 .  cyclobenzaprine (FLEXERIL) 10 MG tablet, Take 10 mg by mouth 3 (three) times daily as needed for muscle spasms., Disp: , Rfl:  .  pravastatin (PRAVACHOL) 40 MG tablet, Take 40 mg by mouth daily. PATIENT TAKING AS NEEDED, Disp: , Rfl:    No Known Allergies   The patient states she uses none for birth control. Last LMP was No LMP recorded. Patient is postmenopausal.. Negative for: breast discharge, breast lump(s), breast pain and breast self exam. Associated symptoms include abnormal vaginal bleeding. Pertinent negatives include abnormal bleeding (hematology), anxiety, decreased libido, depression, difficulty falling sleep, dyspareunia, history of infertility, nocturia, sexual dysfunction, sleep disturbances, urinary incontinence, urinary urgency, vaginal discharge and vaginal itching. Diet regular.The patient states her exercise level is   minimal.   . The patient's tobacco use is:  Social History   Tobacco Use  Smoking Status Never Smoker  Smokeless Tobacco Never Used  . She has been  exposed to passive smoke. The patient's alcohol use is:  Social History   Substance and Sexual Activity  Alcohol Use No  . Additional information: Last pap 2019 next one scheduled for 2020.   Review of Systems  Constitutional: Negative.   HENT: Negative.   Eyes: Negative.   Respiratory: Negative.   Cardiovascular: Negative.   Gastrointestinal: Negative.   Endocrine: Negative.   Genitourinary: Negative.  Vaginal pain: she reports h/o chronic back pain. Would like refill of muscle relaxer. She uses prn.  Musculoskeletal: Positive for back pain.  Skin: Positive for rash.       She c/o rash on R hand. Started two weeks after her surgery. It does itch, she reports skin was dry and scaly. She has tried some otc hydrocortisone cream with slight relief of her symptoms.   Also feels lipoma on her back is getting larger. It is not painful.   Allergic/Immunologic: Negative.   Neurological: Negative.   Hematological: Negative.   Psychiatric/Behavioral: Negative.      Today's Vitals   06/02/18 0932  BP: 112/66  Pulse: 91  Temp: 98.7 F (37.1 C)  TempSrc: Oral  Weight: 183 lb 12.8 oz (83.4 kg)  Height: 5' 1.8" (1.57 m)   Body mass index is 33.84 kg/m.   Objective:  Physical Exam Vitals signs and nursing note reviewed.  Constitutional:      Appearance: Normal appearance.  HENT:     Head: Normocephalic and atraumatic.  Right Ear: Tympanic membrane, ear canal and external ear normal.     Left Ear: Tympanic membrane, ear canal and external ear normal.     Nose: Nose normal.     Mouth/Throat:     Mouth: Mucous membranes are moist.     Pharynx: Oropharynx is clear.  Eyes:     Extraocular Movements: Extraocular movements intact.     Conjunctiva/sclera: Conjunctivae normal.     Pupils: Pupils are equal, round, and reactive to light.  Neck:     Musculoskeletal: Normal range of motion and neck supple.  Cardiovascular:     Rate and Rhythm: Normal rate and regular rhythm.      Pulses: Normal pulses.     Heart sounds: Normal heart sounds.  Pulmonary:     Effort: Pulmonary effort is normal.     Breath sounds: Normal breath sounds.  Chest:     Breasts:        Right: Normal. No swelling, bleeding, inverted nipple, mass or nipple discharge.        Left: Normal. No swelling, bleeding, inverted nipple, mass or nipple discharge.  Abdominal:     General: Abdomen is flat. Bowel sounds are normal.     Palpations: Abdomen is soft.  Genitourinary:    Comments: deferred Musculoskeletal: Normal range of motion.  Skin:    General: Skin is warm and dry.     Findings: Rash present. Rash is scaling.     Comments: 4cm, flesh colored, soft tissue mass , left mid back, nontender.  Also, scaly rash on r wrist, sl. Hyperpigmented areas on R wrist  Neurological:     General: No focal deficit present.     Mental Status: She is alert and oriented to person, place, and time.  Psychiatric:        Mood and Affect: Mood normal.        Behavior: Behavior normal.         Assessment And Plan:     1. Routine general medical examination at health care facility  A full exam was performed. Importance of monthly self breast exams was discussed with the patient.  PATIENT HAS BEEN ADVISED TO GET 30-45 MINUTES REGULAR EXERCISE NO LESS THAN FOUR TO FIVE DAYS PER WEEK - BOTH WEIGHTBEARING EXERCISES AND AEROBIC ARE RECOMMENDED.  SHE IS ADVISED TO FOLLOW A HEALTHY DIET WITH AT LEAST SIX FRUITS/VEGGIES PER DAY, DECREASE INTAKE OF RED MEAT, AND TO INCREASE FISH INTAKE TO TWO DAYS PER WEEK.  MEATS/FISH SHOULD NOT BE FRIED, BAKED OR BROILED IS PREFERABLE.  I SUGGEST WEARING SPF 50 SUNSCREEN ON EXPOSED PARTS AND ESPECIALLY WHEN IN THE DIRECT SUNLIGHT FOR AN EXTENDED PERIOD OF TIME.  PLEASE AVOID FAST FOOD RESTAURANTS AND INCREASE YOUR WATER INTAKE.   2. Lipoma of torso  She does not wish to seek surgical evaluation at this time. She will let me know if she starts to experience discomfort from this  area.   3. Chronic bilateral low back pain without sciatica  Chronic, intermittent. She was given refill of cyclobenzaprine to use prn.   4. Allergic contact dermatitis due to other agents  She was given rx triamcinolone cream to affected area twice daily as needed.   5. Class 1 obesity due to excess calories without serious comorbidity with body mass index (BMI) of 33.0 to 33.9 in adult  Importance of achieving optimal weight to decrease risk of cardiovascular disease and cancers was discussed with the patient in full detail. She is encouraged  to start slowly - start with 10 minutes twice daily at least three to four days per week and to gradually build to 30 minutes five days weekly. She was given tips to incorporate more activity into her daily routine - take stairs when possible, park farther away from her job, grocery stores, etc.    Maximino Greenland, MD    THE PATIENT IS ENCOURAGED TO PRACTICE SOCIAL DISTANCING DUE TO THE COVID-19 PANDEMIC.

## 2018-06-03 LAB — CMP14+EGFR
ALT: 22 IU/L (ref 0–32)
AST: 22 IU/L (ref 0–40)
Albumin/Globulin Ratio: 1.6 (ref 1.2–2.2)
Albumin: 4.4 g/dL (ref 3.8–4.8)
Alkaline Phosphatase: 91 IU/L (ref 39–117)
BUN/Creatinine Ratio: 14 (ref 12–28)
BUN: 11 mg/dL (ref 8–27)
Bilirubin Total: 0.2 mg/dL (ref 0.0–1.2)
CO2: 25 mmol/L (ref 20–29)
Calcium: 9.5 mg/dL (ref 8.7–10.3)
Chloride: 100 mmol/L (ref 96–106)
Creatinine, Ser: 0.81 mg/dL (ref 0.57–1.00)
GFR calc Af Amer: 89 mL/min/{1.73_m2} (ref >59)
GFR calc non Af Amer: 77 mL/min/{1.73_m2} (ref >59)
Globulin, Total: 2.8 g/dL (ref 1.5–4.5)
Glucose: 91 mg/dL (ref 65–99)
Potassium: 4.9 mmol/L (ref 3.5–5.2)
Sodium: 139 mmol/L (ref 134–144)
Total Protein: 7.2 g/dL (ref 6.0–8.5)

## 2018-06-03 LAB — CBC
Hematocrit: 38 % (ref 34.0–46.6)
Hemoglobin: 12.6 g/dL (ref 11.1–15.9)
MCH: 30.2 pg (ref 26.6–33.0)
MCHC: 33.2 g/dL (ref 31.5–35.7)
MCV: 91 fL (ref 79–97)
Platelets: 253 10*3/uL (ref 150–450)
RBC: 4.17 x10E6/uL (ref 3.77–5.28)
RDW: 12.8 % (ref 11.7–15.4)
WBC: 5.9 10*3/uL (ref 3.4–10.8)

## 2018-06-03 LAB — HEMOGLOBIN A1C
Est. average glucose Bld gHb Est-mCnc: 120 mg/dL
Hgb A1c MFr Bld: 5.8 % — ABNORMAL HIGH (ref 4.8–5.6)

## 2018-06-03 LAB — LIPID PANEL
Chol/HDL Ratio: 3.6 ratio (ref 0.0–4.4)
Cholesterol, Total: 245 mg/dL — ABNORMAL HIGH (ref 100–199)
HDL: 69 mg/dL (ref >39)
LDL Calculated: 157 mg/dL — ABNORMAL HIGH (ref 0–99)
Triglycerides: 96 mg/dL (ref 0–149)
VLDL Cholesterol Cal: 19 mg/dL (ref 5–40)

## 2018-07-06 ENCOUNTER — Other Ambulatory Visit: Payer: Self-pay | Admitting: Internal Medicine

## 2018-07-06 NOTE — Telephone Encounter (Signed)
Gabapentin refill

## 2018-07-21 DIAGNOSIS — Z6834 Body mass index (BMI) 34.0-34.9, adult: Secondary | ICD-10-CM | POA: Diagnosis not present

## 2018-07-21 DIAGNOSIS — E669 Obesity, unspecified: Secondary | ICD-10-CM | POA: Diagnosis not present

## 2018-07-21 DIAGNOSIS — D171 Benign lipomatous neoplasm of skin and subcutaneous tissue of trunk: Secondary | ICD-10-CM | POA: Diagnosis not present

## 2018-08-12 ENCOUNTER — Other Ambulatory Visit: Payer: Self-pay | Admitting: Internal Medicine

## 2018-09-30 ENCOUNTER — Other Ambulatory Visit: Payer: Self-pay | Admitting: Internal Medicine

## 2018-10-22 DIAGNOSIS — Z01419 Encounter for gynecological examination (general) (routine) without abnormal findings: Secondary | ICD-10-CM | POA: Diagnosis not present

## 2018-10-22 DIAGNOSIS — Z1151 Encounter for screening for human papillomavirus (HPV): Secondary | ICD-10-CM | POA: Diagnosis not present

## 2018-10-22 DIAGNOSIS — Z6834 Body mass index (BMI) 34.0-34.9, adult: Secondary | ICD-10-CM | POA: Diagnosis not present

## 2018-11-04 ENCOUNTER — Other Ambulatory Visit: Payer: Self-pay | Admitting: Obstetrics and Gynecology

## 2018-11-04 DIAGNOSIS — E2839 Other primary ovarian failure: Secondary | ICD-10-CM

## 2018-11-11 ENCOUNTER — Other Ambulatory Visit: Payer: Self-pay | Admitting: Internal Medicine

## 2018-11-11 ENCOUNTER — Ambulatory Visit: Payer: Federal, State, Local not specified - PPO | Admitting: Orthopedic Surgery

## 2018-12-07 ENCOUNTER — Ambulatory Visit: Payer: Federal, State, Local not specified - PPO | Admitting: Internal Medicine

## 2018-12-28 ENCOUNTER — Other Ambulatory Visit: Payer: Self-pay | Admitting: Internal Medicine

## 2019-01-12 ENCOUNTER — Other Ambulatory Visit: Payer: Self-pay | Admitting: Internal Medicine

## 2019-01-25 ENCOUNTER — Encounter: Payer: Self-pay | Admitting: Internal Medicine

## 2019-01-25 ENCOUNTER — Other Ambulatory Visit: Payer: Self-pay

## 2019-01-25 ENCOUNTER — Ambulatory Visit: Payer: Federal, State, Local not specified - PPO | Admitting: Internal Medicine

## 2019-01-25 VITALS — BP 116/82 | HR 94 | Temp 98.5°F | Ht 61.8 in | Wt 187.0 lb

## 2019-01-25 DIAGNOSIS — R7309 Other abnormal glucose: Secondary | ICD-10-CM

## 2019-01-25 DIAGNOSIS — G5711 Meralgia paresthetica, right lower limb: Secondary | ICD-10-CM

## 2019-01-25 DIAGNOSIS — E559 Vitamin D deficiency, unspecified: Secondary | ICD-10-CM | POA: Diagnosis not present

## 2019-01-25 DIAGNOSIS — E6609 Other obesity due to excess calories: Secondary | ICD-10-CM

## 2019-01-25 DIAGNOSIS — E7800 Pure hypercholesterolemia, unspecified: Secondary | ICD-10-CM | POA: Diagnosis not present

## 2019-01-25 DIAGNOSIS — Z23 Encounter for immunization: Secondary | ICD-10-CM

## 2019-01-25 DIAGNOSIS — Z6834 Body mass index (BMI) 34.0-34.9, adult: Secondary | ICD-10-CM

## 2019-01-25 DIAGNOSIS — E66811 Obesity, class 1: Secondary | ICD-10-CM

## 2019-01-25 MED ORDER — VITAMIN D (ERGOCALCIFEROL) 1.25 MG (50000 UNIT) PO CAPS: ORAL_CAPSULE | ORAL | 0 refills | Status: DC

## 2019-01-25 MED ORDER — LIDOCAINE 5 % EX PTCH: 1.0000 | MEDICATED_PATCH | Freq: Two times a day (BID) | CUTANEOUS | 0 refills | Status: DC

## 2019-01-25 NOTE — Progress Notes (Signed)
This visit occurred during the SARS-CoV-2 public health emergency.  Safety protocols were in place, including screening questions prior to the visit, additional usage of staff PPE, and extensive cleaning of exam room while observing appropriate contact time as indicated for disinfecting solutions.  Subjective:     Patient ID: Krista Miles , female    DOB: 08-20-1955 , 64 y.o.   MRN: [id number]   Chief Complaint  Patient presents with  . Hyperlipidemia    HPI  Hyperlipidemia This is a chronic problem. The problem is controlled. Exacerbating diseases include obesity. She has no history of diabetes. Current antihyperlipidemic treatment includes statins. The current treatment provides moderate improvement of lipids. Compliance problems include adherence to exercise.      Past Medical History:  Diagnosis Date  . Hyperlipidemia      Family History  Problem Relation Age of Onset  . Dementia Mother   . Cancer Father   . Leukemia Father   . Cancer Maternal Aunt      Current Outpatient Medications:  .  gabapentin (NEURONTIN) 300 MG capsule, TAKE 1 CAPSULE BY MOUTH TWICE A DAY, Disp: 180 capsule, Rfl: 1 .  pravastatin (PRAVACHOL) 40 MG tablet, TAKE 1 TABLET (40 MG TOTAL) BY MOUTH DAILY. PATIENT TAKING AS NEEDED, Disp: 90 tablet, Rfl: 1 .  cyclobenzaprine (FLEXERIL) 10 MG tablet, Take 1 tablet (10 mg total) by mouth 3 times/day as needed-between meals & bedtime for muscle spasms. (Patient not taking: Reported on 01/25/2019), Disp: 30 tablet, Rfl: 0 .  lidocaine (LIDODERM) 5 %, Place 1 patch onto the skin every 12 (twelve) hours. May use 1-2 patches at a time, Remove & Discard patch within 12 hours or as directed by MD, Disp: 60 patch, Rfl: 0 .  Vitamin D, Ergocalciferol, (DRISDOL) 1.25 MG (50000 UNIT) CAPS capsule, TAKE 1 CAPSULE EVERY WEEK ON TUESDAYS AND THURSDAYS, Disp: 24 capsule, Rfl: 0   No Known Allergies   Review of Systems  Constitutional: Negative.   Respiratory:  Negative.   Cardiovascular: Negative.   Gastrointestinal: Negative.   Neurological: Positive for numbness.       (R) thigh and toe tingling   Psychiatric/Behavioral: Negative.      Today's Vitals   01/25/19 0838  BP: 116/82  Pulse: 94  Temp: 98.5 F (36.9 C)  TempSrc: Oral  Weight: 187 lb (84.8 kg)  Height: 5' 1.8" (1.57 m)   Body mass index is 34.42 kg/m.   Objective:  Physical Exam Vitals and nursing note reviewed.  Constitutional:      Appearance: Normal appearance.  HENT:     Head: Normocephalic and atraumatic.  Cardiovascular:     Rate and Rhythm: Normal rate and regular rhythm.     Heart sounds: Normal heart sounds.  Pulmonary:     Effort: Pulmonary effort is normal.     Breath sounds: Normal breath sounds.  Skin:    General: Skin is warm.  Neurological:     General: No focal deficit present.     Mental Status: She is alert.  Psychiatric:        Mood and Affect: Mood normal.        Behavior: Behavior normal.         Assessment And Plan:     1. Pure hypercholesterolemia  Chronic.  I will check a fasting lipid panel and LFTs today. She is encouraged to avoid fried foods and to incorporate more exercise into her daily routine.   - Lipid panel  2.  Meralgia paresthetica of right side  Her sx are suggestive of MP. She was given some information to read at her leisure. She is advised to use lidocaine patch to affected areas as needed. She was told to get over the counter if her insurance does not cover it.   3. Other abnormal glucose  HER A1C HAS BEEN ELEVATED IN THE PAST. I WILL CHECK AN A1C, BMET TODAY. SHE WAS ENCOURAGED TO AVOID SUGARY BEVERAGES AND PROCESSED FOODS INCLUDNG BREADS, RICE AND PASTA.  - Hemoglobin A1c  4. Vitamin D deficiency  I WILL CHECK A VIT D LEVEL AND SUPPLEMENT AS NEEDED.  ALSO ENCOURAGED TO SPEND 15 MINUTES IN THE SUN DAILY.  - Vitamin D (25 hydroxy)  5. Class 1 obesity due to excess calories with serious comorbidity and  body mass index (BMI) of 34.0 to 34.9 in adult  She is encouraged to incorporate more exercise into her regular routine. She is encouraged to strive for at least 150 minutes per week.   6. Immunization due  She declined both Tdap and flu vaccines today.   Maximino Greenland, MD    THE PATIENT IS ENCOURAGED TO PRACTICE SOCIAL DISTANCING DUE TO THE COVID-19 PANDEMIC.

## 2019-01-25 NOTE — Patient Instructions (Signed)

## 2019-01-26 ENCOUNTER — Encounter: Payer: Self-pay | Admitting: Internal Medicine

## 2019-01-26 LAB — LIPID PANEL
Chol/HDL Ratio: 3.5 ratio (ref 0.0–4.4)
Cholesterol, Total: 205 mg/dL — ABNORMAL HIGH (ref 100–199)
HDL: 59 mg/dL (ref >39)
LDL Chol Calc (NIH): 126 mg/dL — ABNORMAL HIGH (ref 0–99)
Triglycerides: 111 mg/dL (ref 0–149)
VLDL Cholesterol Cal: 20 mg/dL (ref 5–40)

## 2019-01-26 LAB — HEMOGLOBIN A1C
Est. average glucose Bld gHb Est-mCnc: 120 mg/dL
Hgb A1c MFr Bld: 5.8 % — ABNORMAL HIGH (ref 4.8–5.6)

## 2019-01-26 LAB — VITAMIN D 25 HYDROXY (VIT D DEFICIENCY, FRACTURES): Vit D, 25-Hydroxy: 103 ng/mL — ABNORMAL HIGH (ref 30.0–100.0)

## 2019-02-10 ENCOUNTER — Other Ambulatory Visit: Payer: Self-pay | Admitting: Internal Medicine

## 2019-02-11 ENCOUNTER — Ambulatory Visit
Admission: RE | Admit: 2019-02-11 | Discharge: 2019-02-11 | Disposition: A | Discharge status: Home / Self Care | Payer: Federal, State, Local not specified - PPO | Source: Ambulatory Visit | Attending: Obstetrics and Gynecology | Admitting: Obstetrics and Gynecology

## 2019-02-11 ENCOUNTER — Other Ambulatory Visit: Payer: Self-pay

## 2019-02-11 ENCOUNTER — Other Ambulatory Visit: Payer: Self-pay | Admitting: Obstetrics and Gynecology

## 2019-02-11 DIAGNOSIS — M810 Age-related osteoporosis without current pathological fracture: Secondary | ICD-10-CM | POA: Diagnosis not present

## 2019-02-11 DIAGNOSIS — Z1231 Encounter for screening mammogram for malignant neoplasm of breast: Secondary | ICD-10-CM

## 2019-02-11 DIAGNOSIS — Z780 Asymptomatic menopausal state: Secondary | ICD-10-CM | POA: Diagnosis not present

## 2019-02-11 DIAGNOSIS — E2839 Other primary ovarian failure: Secondary | ICD-10-CM

## 2019-02-11 DIAGNOSIS — M85851 Other specified disorders of bone density and structure, right thigh: Secondary | ICD-10-CM | POA: Diagnosis not present

## 2019-02-18 ENCOUNTER — Other Ambulatory Visit: Payer: Self-pay | Admitting: Internal Medicine

## 2019-02-18 ENCOUNTER — Ambulatory Visit (INDEPENDENT_AMBULATORY_CARE_PROVIDER_SITE_OTHER): Payer: Federal, State, Local not specified - PPO | Admitting: Family Medicine

## 2019-02-18 ENCOUNTER — Other Ambulatory Visit: Payer: Self-pay

## 2019-02-18 ENCOUNTER — Encounter (INDEPENDENT_AMBULATORY_CARE_PROVIDER_SITE_OTHER): Payer: Self-pay | Admitting: Family Medicine

## 2019-02-18 VITALS — BP 112/78 | HR 91 | Temp 98.4°F | Ht 62.0 in | Wt 184.0 lb

## 2019-02-18 DIAGNOSIS — Z9189 Other specified personal risk factors, not elsewhere classified: Secondary | ICD-10-CM

## 2019-02-18 DIAGNOSIS — Z0289 Encounter for other administrative examinations: Secondary | ICD-10-CM

## 2019-02-18 DIAGNOSIS — R739 Hyperglycemia, unspecified: Secondary | ICD-10-CM | POA: Diagnosis not present

## 2019-02-18 DIAGNOSIS — R0602 Shortness of breath: Secondary | ICD-10-CM | POA: Diagnosis not present

## 2019-02-18 DIAGNOSIS — E669 Obesity, unspecified: Secondary | ICD-10-CM

## 2019-02-18 DIAGNOSIS — E559 Vitamin D deficiency, unspecified: Secondary | ICD-10-CM | POA: Diagnosis not present

## 2019-02-18 DIAGNOSIS — Z1331 Encounter for screening for depression: Secondary | ICD-10-CM

## 2019-02-18 DIAGNOSIS — Z6833 Body mass index (BMI) 33.0-33.9, adult: Secondary | ICD-10-CM

## 2019-02-18 DIAGNOSIS — R5383 Other fatigue: Secondary | ICD-10-CM

## 2019-02-18 NOTE — Progress Notes (Unsigned)
Office: [phone]  /  Fax: [phone]    Date: February 25, 2019    Appointment Start Time: *** Duration: *** minutes Provider: Glennie Isle, Psy.D. Type of Session: Intake for Individual Therapy  Location of Patient: {gbptloc:23249} Location of Provider: {Location of Service:22491} Type of Contact: Telepsychological Visit via {gbtelepsych:23399}  Informed Consent: Prior to proceeding with today's appointment, two pieces of identifying information were obtained. In addition, Kannon's physical location at the time of this appointment was obtained as well a phone number she could be reached at in the event of technical difficulties. Reshunda and this provider participated in today's telepsychological service.   The provider's role was explained to Lelon Frohlich. The provider reviewed and discussed issues of confidentiality, privacy, and limits therein (e.g., reporting obligations). In addition to verbal informed consent, written informed consent for psychological services was obtained prior to the initial appointment. Since the clinic is not a 24/7 crisis center, mental health emergency resources were shared and this  provider explained MyChart, e-mail, voicemail, and/or other messaging systems should be utilized only for non-emergency reasons. This provider also explained that information obtained during appointments will be placed in Jackelynn's medical record and relevant information will be shared with other providers at [facility] for coordination of care. Moreover, Dajahnae agreed information may be shared with other [facility] providers as needed for coordination of care. By signing the service agreement document, Zian provided written consent for coordination of care. Prior to initiating telepsychological services, Cerissa completed an informed consent document, which included the development of a safety plan (i.e., an emergency contact, nearest  emergency room, and emergency resources) in the event of an emergency/crisis. Nakeysha expressed understanding of the rationale of the safety plan. Ebonee verbally acknowledged understanding she is ultimately responsible for understanding her insurance benefits for telepsychological and in-person services. This provider also reviewed confidentiality, as it relates to telepsychological services, as well as the rationale for telepsychological services (i.e., to reduce exposure risk to COVID-19). Amaira  acknowledged understanding that appointments cannot be recorded without both party consent and she is aware she is responsible for securing confidentiality on her end of the session. Tikita verbally consented to proceed.  Chief Complaint/HPI: Ignacia was referred by Dr. Dennard Nip. The note for the initial appointment with Dr. Dennard Nip on February 18, 2019 indicated the following: "Her family eats meals together, she thinks her family will eat healthier with her, her desired weight loss is 44 lbs, she started gaining weight in 2019, her heaviest weight ever was 185 pounds, she snacks frequently in the evenings, she wakes up frequently in the middle of the night to eat, she frequently makes poor food choices and she struggles with emotional eating." Ragan's Food and Mood (modified PHQ-9) score on February 18, 2019 was 2.  During today's appointment, Harmoni was verbally administered a questionnaire assessing various behaviors related to emotional eating. Sanaiyah endorsed the following: {gbmoodandfood:21755}. She shared she craves ***. Manya believes the onset of emotional eating was *** and described the current frequency of emotional eating as ***. In addition, Quantavia {gblegal:22371} a history of binge eating. *** Moreover, Yasuko indicated *** triggers emotional eating, whereas *** makes emotional eating better. Furthermore, Eileen {gblegal:22371} other problems of concern. ***    Mental Status Examination:  Appearance: {Appearance:22431} Behavior: {Behavior:22445} Mood: {gbmood:21757} Affect: {Affect:22436} Speech: {Speech:22432} Eye Contact: {Eye Contact:22433} Psychomotor Activity: {Motor Activity:22434} Gait: {gbgait:23404} Thought Process: {thought process:22448}  Thought Content/Perception: {disturbances:22451} Orientation: {Orientation:22437} Memory/Concentration: {gbcognition:22449} Insight/Judgment: {Insight:22446}  Family & Psychosocial History: Raylen reported she is *** and ***. She indicated she is currently ***. Additionally, Nyoka shared her highest level of education obtained is ***. Currently, Kenasia's social support system consists of ***. Moreover, Ludmilla stated she resides with her ***.   Medical History:  Past Medical History:  Diagnosis Date  . Back pain   . Hyperlipidemia   . Prediabetes   . Vitamin D deficiency    Past Surgical History:  Procedure Laterality Date  . CESAREAN SECTION    . HAND SURGERY Bilateral 2016  . TRIGGER FINGER RELEASE Left 10/04/2017   Dr. Alphonzo Severance  . TRIGGER FINGER RELEASE Right 05/06/2018   right ring finger   Current Outpatient Medications on File Prior to Visit  Medication Sig Dispense Refill  . gabapentin (NEURONTIN) 300 MG capsule TAKE 1 CAPSULE BY MOUTH TWICE A DAY 180 capsule 1  . pravastatin (PRAVACHOL) 40 MG tablet TAKE 1 TABLET (40 MG TOTAL) BY MOUTH DAILY. PATIENT TAKING AS NEEDED 90 tablet 1   No current facility-administered medications on file prior to visit.    Mental Health History: Danyle {Endorse or deny of item:23407} therapeutic services. Guinevere {Endorse or deny of item:23407} hospitalizations for psychiatric concerns, and she has never met with a psychiatrist.*** Meygan stated she was *** psychotropic medications. Cahterine {gblegal:22371} a family history of mental health related concerns. *** Denzil {Endorse or deny of item:23407} trauma including  {gbtrauma:22071} abuse, as well as neglect. ***  Rosha described her typical mood lately as ***. Aside from concerns noted above and endorsed on the PHQ-9 and GAD-7, Calirose reported ***. Lasaundra {gblegal:22371} current alcohol use. *** She {gblegal:22371} tobacco use. *** She {PRFFMBW:46659} illicit/recreational substance use. Regarding caffeine intake, Docie reported ***. Furthermore, Madisyn indicated she is not experiencing the following: {gbsxs:21965}. She also denied history of and current suicidal ideation, plan, and intent; history of and current homicidal ideation, plan, and intent; and history of and current engagement in self-harm.  The following strengths were reported by Jhordan: ***. The following strengths were observed by this provider: {gbstrengths:22223}.  Legal History: [place] {Endorse or deny of item:23407} legal involvement.   Structured Assessments Results: The Patient Health Questionnaire-9 (PHQ-9) is a self-report measure that assesses symptoms and severity of depression over the course of the last two weeks. Kadedra obtained a score of *** suggesting {GBPHQ9SEVERITY:21752}. Brynlei finds the endorsed symptoms to be {gbphq9difficulty:21754}. [0= Not at all; 1= Several days; 2= More than half the days; 3= Nearly every day] Little interest or pleasure in doing things ***  Feeling down, depressed, or hopeless ***  Trouble falling or staying asleep, or sleeping too much ***  Feeling tired or having little energy ***  Poor appetite or overeating ***  Feeling bad about yourself --- or that you are a failure or have let yourself or your family down ***  Trouble concentrating on things, such as reading the newspaper or watching television ***  Moving or speaking so slowly that other people could have noticed? Or the opposite --- being so fidgety or restless that you have been moving around a lot more than usual ***  Thoughts that you would be better off dead or hurting  yourself in some way ***  PHQ-9 Score ***    The Generalized Anxiety Disorder-7 (GAD-7) is a brief self-report measure that assesses symptoms of anxiety over the course of the last two weeks. Dhara obtained a score of *** suggesting {gbgad7severity:21753}. Eutha finds the endorsed symptoms to be {gbphq9difficulty:21754}. [0=  Not at all; 1= Several days; 2= Over half the days; 3= Nearly every day] Feeling nervous, anxious, on edge ***  Not being able to stop or control worrying ***  Worrying too much about different things ***  Trouble relaxing ***  Being so restless that it's hard to sit still ***  Becoming easily annoyed or irritable ***  Feeling afraid as if something awful might happen ***  GAD-7 Score ***   Interventions:  {Interventions List for Intake:23406}  Provisional DSM-5 Diagnosis: {Diagnoses:22752}  Plan: Affie appears able and willing to participate as evidenced by collaboration on a treatment goal, engagement in reciprocal conversation, and asking questions as needed for clarification. The next appointment will be scheduled in {gbweeks:21758}, which will be {gbtxmodality:23402}. The following treatment goal was established: {gbtxgoals:21759}. This provider will regularly review the treatment plan and medical chart to keep informed of status changes. Cherrell expressed understanding and agreement with the initial treatment plan of care. *** Saloni will be sent a handout via e-mail to utilize between now and the next appointment to increase awareness of hunger patterns and subsequent eating. Kristie provided verbal consent during today's appointment for this provider to send the handout via e-mail. ***

## 2019-02-18 NOTE — Progress Notes (Signed)
Dear Servando Salina, MD,   Thank you for referring Krista Miles to our clinic. The following note includes my evaluation and treatment recommendations.  Chief Complaint:   Krista Miles (MR# [id number]) is a 64 y.o. female who presents for evaluation and treatment of Krista and related comorbidities. Current BMI is Body mass index is 33.65 kg/m. Krista Miles has been struggling with her weight for many years and has been unsuccessful in either losing weight, maintaining weight loss, or reaching her healthy weight goal.  Krista Miles is currently in the action stage of change and ready to dedicate time achieving and maintaining a healthier weight. Krista Miles is interested in becoming our patient and working on intensive lifestyle modifications including (but not limited to) diet and exercise for weight loss.  Krista Miles's habits were reviewed today and are as follows: Her family eats meals together, she thinks her family will eat healthier with her, her desired weight loss is 44 lbs, she started gaining weight in 2019, her heaviest weight ever was 185 pounds, she snacks frequently in the evenings, she wakes up frequently in the middle of the night to eat, she frequently makes poor food choices and she struggles with emotional eating.  Depression Screen Krista Miles's Food and Mood (modified PHQ-9) score was 2.  Depression screen Krista Miles  Decreased Interest 0  Down, Depressed, Hopeless 0  PHQ - 2 Score 0  Altered sleeping 0  Tired, decreased energy 1  Change in appetite 1  Feeling bad or failure about yourself  0  Trouble concentrating 0  Moving slowly or fidgety/restless 0  Suicidal thoughts 0  PHQ-9 Score 2  Difficult doing work/chores Not difficult at all   Subjective:   1. Other fatigue Krista Miles admits to daytime somnolence and admits to waking up still tired. Patent has a history of symptoms of daytime fatigue and morning headache. Krista Miles generally gets  6 hours of sleep per night, and states that she has generally restful sleep. Snoring is present. Apneic episodes are not present. Epworth Sleepiness Score is 6.  2. Shortness of breath on exertion Kissy notes increasing shortness of breath with exercising and seems to be worsening over time with weight gain. She notes getting out of breath sooner with activity than she used to. This has not gotten worse recently. Mende denies shortness of breath at rest or orthopnea.  3. Hyperglycemia Krista Miles has a history of elevated glucose and A1c. She is not on metformin and she is attempting to control with diet.  4. Vitamin D deficiency Krista Miles is not on Vit D. She has been over-replaced with a Vit D level >100.  5. At risk for diabetes mellitus Krista Miles is at higher than average risk for developing diabetes due to her Krista.   Assessment/Plan:   1. Other fatigue Krista Miles does feel that her weight is causing her energy to be lower than it should be. Fatigue may be related to Krista, depression or many other causes. Labs will be ordered, and in the meanwhile, Tiarna will focus on self care including making healthy food choices, increasing physical activity and focusing on stress reduction.  - EKG 12-Lead - Comprehensive metabolic panel - Folate - TSH - T3 - CBC with Differential/Platelet - T4, free  2. Shortness of breath on exertion Krista Miles does feel that she gets out of breath more easily that she used to when she exercises. Chrislyn's shortness of breath appears to be Krista related and exercise induced. She has agreed to  work on weight loss and gradually increase exercise to treat her exercise induced shortness of breath. Will continue to monitor closely.  3. Hyperglycemia Fasting labs will be obtained today and results with be discussed with Krista Miles in 2 weeks at her follow up visit. In the meanwhile Krista Miles was started on a lower simple carbohydrate diet and will work on  weight loss efforts.  - Insulin, random - Vitamin B12  4. Vitamin D deficiency Low Vitamin D level contributes to fatigue and are associated with Krista, breast, and colon cancer. Krista Miles will hold off on Vit D and will follow-up for routine testing of Vitamin D, at least 2-3 times per year to avoid over-replacement. We will recheck labs in 3 months.  5. Depression screening Krista Miles had a negative depression screening. Depression is commonly associated with Krista and often results in emotional eating behaviors. We will monitor this closely and work on CBT to help improve the non-hunger eating patterns. Referral to Psychology may be required if no improvement is seen as she continues in our clinic.  6. At risk for diabetes mellitus Krista Miles was given approximately 15 minutes of diabetes education and counseling today. We discussed intensive lifestyle modifications today with an emphasis on weight loss as well as increasing exercise and decreasing simple carbohydrates in her diet. We also reviewed medication options with an emphasis on risk versus benefit of those discussed.   Repetitive spaced learning was employed today to elicit superior memory formation and behavioral change.  7. Class 1 Krista with serious comorbidity and body mass index (BMI) of 33.0 to 33.9 in adult, unspecified Krista type Krista Miles is currently in the action stage of change and her goal is to continue with weight loss efforts. I recommend Krista Miles begin the structured treatment plan as follows:  She has agreed to the Category 2 Plan + 100 calories.  Exercise goals: No exercise has been prescribed at this time.   Behavioral modification strategies: no skipping meals and meal planning and cooking strategies.  She was informed of the importance of frequent follow-up visits to maximize her success with intensive lifestyle modifications for her multiple health conditions. She was informed we would discuss her lab  results at her next visit unless there is a critical issue that needs to be addressed sooner. Donyel agreed to keep her next visit at the agreed upon time to discuss these results.  Objective:   Blood pressure 112/78, pulse 91, temperature 98.4 F (36.9 C), temperature source Oral, height 5\' 2"  (1.575 m), weight 184 lb (83.5 kg), SpO2 98 %. Body mass index is 33.65 kg/m.  EKG: Normal sinus rhythm, rate 92 BPM.  Indirect Calorimeter completed today shows a VO2 of 249 and a REE of 1733.  Her calculated basal metabolic rate is 0000000 thus her basal metabolic rate is better than expected.  General: Cooperative, alert, well developed, in no acute distress. HEENT: Conjunctivae and lids unremarkable. Cardiovascular: Regular rhythm.  Lungs: Normal work of breathing. Neurologic: No focal deficits.   Lab Results  Component Value Date   CREATININE 0.81 06/02/2018   BUN 11 06/02/2018   NA 139 06/02/2018   K 4.9 06/02/2018   CL 100 06/02/2018   CO2 25 06/02/2018   Lab Results  Component Value Date   ALT 22 06/02/2018   AST 22 06/02/2018   ALKPHOS 91 06/02/2018   BILITOT 0.2 06/02/2018   Lab Results  Component Value Date   HGBA1C 5.8 (H) 01/25/2019   HGBA1C 5.8 (H) 06/02/2018  HGBA1C 5.7 05/01/2017   No results found for: INSULIN No results found for: TSH Lab Results  Component Value Date   CHOL 205 (H) 01/25/2019   HDL 59 01/25/2019   LDLCALC 126 (H) 01/25/2019   TRIG 111 01/25/2019   CHOLHDL 3.5 01/25/2019   Lab Results  Component Value Date   WBC 5.9 06/02/2018   HGB 12.6 06/02/2018   HCT 38.0 06/02/2018   MCV 91 06/02/2018   PLT 253 06/02/2018   No results found for: IRON, TIBC, FERRITIN  Attestation Statements:   Reviewed by clinician on day of visit: allergies, medications, problem list, medical history, surgical history, family history, social history, and previous encounter notes.   I, Trixie Dredge, am acting as transcriptionist for Dennard Nip, MD.  I  have reviewed the above documentation for accuracy and completeness, and I agree with the above. - Dennard Nip, MD

## 2019-02-19 LAB — COMPREHENSIVE METABOLIC PANEL
ALT: 17 IU/L (ref 0–32)
AST: 16 IU/L (ref 0–40)
Albumin/Globulin Ratio: 1.4 (ref 1.2–2.2)
Albumin: 4.6 g/dL (ref 3.8–4.8)
Alkaline Phosphatase: 104 IU/L (ref 39–117)
BUN/Creatinine Ratio: 9 — ABNORMAL LOW (ref 12–28)
BUN: 7 mg/dL — ABNORMAL LOW (ref 8–27)
Bilirubin Total: 0.2 mg/dL (ref 0.0–1.2)
CO2: 21 mmol/L (ref 20–29)
Calcium: 9.6 mg/dL (ref 8.7–10.3)
Chloride: 102 mmol/L (ref 96–106)
Creatinine, Ser: 0.76 mg/dL (ref 0.57–1.00)
GFR calc Af Amer: 96 mL/min/{1.73_m2} (ref >59)
GFR calc non Af Amer: 83 mL/min/{1.73_m2} (ref >59)
Globulin, Total: 3.3 g/dL (ref 1.5–4.5)
Glucose: 100 mg/dL — ABNORMAL HIGH (ref 65–99)
Potassium: 4.3 mmol/L (ref 3.5–5.2)
Sodium: 139 mmol/L (ref 134–144)
Total Protein: 7.9 g/dL (ref 6.0–8.5)

## 2019-02-19 LAB — CBC WITH DIFFERENTIAL/PLATELET
Basophils Absolute: 0 10*3/uL (ref 0.0–0.2)
Basos: 1 %
EOS (ABSOLUTE): 0 10*3/uL (ref 0.0–0.4)
Eos: 0 %
Hematocrit: 39.2 % (ref 34.0–46.6)
Hemoglobin: 12.9 g/dL (ref 11.1–15.9)
Immature Grans (Abs): 0 10*3/uL (ref 0.0–0.1)
Immature Granulocytes: 0 %
Lymphocytes Absolute: 1.6 10*3/uL (ref 0.7–3.1)
Lymphs: 26 %
MCH: 30.4 pg (ref 26.6–33.0)
MCHC: 32.9 g/dL (ref 31.5–35.7)
MCV: 93 fL (ref 79–97)
Monocytes Absolute: 0.5 10*3/uL (ref 0.1–0.9)
Monocytes: 8 %
Neutrophils Absolute: 3.9 10*3/uL (ref 1.4–7.0)
Neutrophils: 65 %
Platelets: 215 10*3/uL (ref 150–450)
RBC: 4.24 x10E6/uL (ref 3.77–5.28)
RDW: 13 % (ref 11.7–15.4)
WBC: 6 10*3/uL (ref 3.4–10.8)

## 2019-02-19 LAB — TSH: TSH: 0.577 u[IU]/mL (ref 0.450–4.500)

## 2019-02-19 LAB — T4, FREE: Free T4: 1.19 ng/dL (ref 0.82–1.77)

## 2019-02-19 LAB — T3: T3, Total: 123 ng/dL (ref 71–180)

## 2019-02-19 LAB — INSULIN, RANDOM: INSULIN: 14.3 u[IU]/mL (ref 2.6–24.9)

## 2019-02-19 LAB — FOLATE: Folate: 14.8 ng/mL (ref >3.0)

## 2019-02-19 LAB — VITAMIN B12: Vitamin B-12: 416 pg/mL (ref 232–1245)

## 2019-02-25 ENCOUNTER — Ambulatory Visit (INDEPENDENT_AMBULATORY_CARE_PROVIDER_SITE_OTHER): Payer: Federal, State, Local not specified - PPO | Admitting: Psychology

## 2019-03-04 ENCOUNTER — Encounter (INDEPENDENT_AMBULATORY_CARE_PROVIDER_SITE_OTHER): Payer: Self-pay | Admitting: Family Medicine

## 2019-03-04 ENCOUNTER — Ambulatory Visit (INDEPENDENT_AMBULATORY_CARE_PROVIDER_SITE_OTHER): Payer: Federal, State, Local not specified - PPO | Admitting: Family Medicine

## 2019-03-04 ENCOUNTER — Other Ambulatory Visit: Payer: Self-pay

## 2019-03-04 VITALS — BP 104/71 | HR 100 | Temp 98.2°F | Ht 62.0 in | Wt 182.0 lb

## 2019-03-04 DIAGNOSIS — E669 Obesity, unspecified: Secondary | ICD-10-CM | POA: Diagnosis not present

## 2019-03-04 DIAGNOSIS — R7303 Prediabetes: Secondary | ICD-10-CM | POA: Diagnosis not present

## 2019-03-04 DIAGNOSIS — Z6833 Body mass index (BMI) 33.0-33.9, adult: Secondary | ICD-10-CM

## 2019-03-04 DIAGNOSIS — E559 Vitamin D deficiency, unspecified: Secondary | ICD-10-CM | POA: Diagnosis not present

## 2019-03-08 NOTE — Progress Notes (Signed)
Chief Complaint:   OBESITY Krista Miles is here to discuss her progress with her obesity treatment plan along with follow-up of her obesity related diagnoses. Krista Miles is on the Category 2 Plan + 100 calories and states she is following her eating plan approximately 50% of the time. Krista Miles states she is doing 0 minutes 0 times per week.  Today's visit was #: 2 Starting weight: 184 lbs Starting date: 02/18/2019 Today's weight: 182 lbs Today's date: 03/04/2019 Total lbs lost to date: 2 Total lbs lost since last in-office visit: 2  Interim History: Krista Miles did well with weight loss but she struggled to follow her plan closely. She wasn't able to eat all of her food, and it doesn't sound like she met her protein goals.  Subjective:   1. Pre-diabetes Krista Miles's last A1c was elevated at 5.8, and she has a history of elevated glucose and fasting insulin. She is trying to avoid having to take medications.  2. Vitamin D deficiency Krista Miles is on Vit D prescription and her Vit D level was over-replaced at 103. She denies nausea, vomiting, or muscle weakness.  Assessment/Plan:   1. Pre-diabetes Krista Miles will continue to work on weight loss, diet, exercise, and decreasing simple carbohydrates to help decrease the risk of diabetes. We will recheck labs in 3 months.  2. Vitamin D deficiency Low Vitamin D level contributes to fatigue and are associated with obesity, breast, and colon cancer. Krista Miles agreed to discontinue prescription Vitamin D and will follow-up for routine testing of Vitamin D, at least 2-3 times per year to avoid over-replacement. We will recheck labs in 3 months.  3. Class 1 obesity with serious comorbidity and body mass index (BMI) of 33.0 to 33.9 in adult, unspecified obesity type Krista Miles is currently in the action stage of change. As such, her goal is to continue with weight loss efforts. She has agreed to keeping a food journal and adhering to recommended goals of  1000-1200 calories and 70+ grams of protein daily.   Behavioral modification strategies: increasing lean protein intake and meal planning and cooking strategies.  Krista Miles has agreed to follow-up with our clinic in 2 weeks. She was informed of the importance of frequent follow-up visits to maximize her success with intensive lifestyle modifications for her multiple health conditions.   Objective:   Blood pressure 104/71, pulse 100, temperature 98.2 F (36.8 C), temperature source Oral, height 5' 2"  (1.575 m), weight 182 lb (82.6 kg), SpO2 98 %. Body mass index is 33.29 kg/m.  General: Cooperative, alert, well developed, in no acute distress. HEENT: Conjunctivae and lids unremarkable. Cardiovascular: Regular rhythm.  Lungs: Normal work of breathing. Neurologic: No focal deficits.   Lab Results  Component Value Date   CREATININE 0.76 02/18/2019   BUN 7 (L) 02/18/2019   NA 139 02/18/2019   K 4.3 02/18/2019   CL 102 02/18/2019   CO2 21 02/18/2019   Lab Results  Component Value Date   ALT 17 02/18/2019   AST 16 02/18/2019   ALKPHOS 104 02/18/2019   BILITOT 0.2 02/18/2019   Lab Results  Component Value Date   HGBA1C 5.8 (H) 01/25/2019   HGBA1C 5.8 (H) 06/02/2018   HGBA1C 5.7 05/01/2017   Lab Results  Component Value Date   INSULIN 14.3 02/18/2019   Lab Results  Component Value Date   TSH 0.577 02/18/2019   Lab Results  Component Value Date   CHOL 205 (H) 01/25/2019   HDL 59 01/25/2019   LDLCALC 126 (  H) 01/25/2019   TRIG 111 01/25/2019   CHOLHDL 3.5 01/25/2019   Lab Results  Component Value Date   WBC 6.0 02/18/2019   HGB 12.9 02/18/2019   HCT 39.2 02/18/2019   MCV 93 02/18/2019   PLT 215 02/18/2019   No results found for: IRON, TIBC, FERRITIN  Attestation Statements:   Reviewed by clinician on day of visit: allergies, medications, problem list, medical history, surgical history, family history, social history, and previous encounter notes.  Time spent  on visit including pre-visit chart review and post-visit care and charting was 40 minutes.    I, Trixie Dredge, am acting as transcriptionist for Dennard Nip, MD.  I have reviewed the above documentation for accuracy and completeness, and I agree with the above. -  Dennard Nip, MD

## 2019-03-17 ENCOUNTER — Other Ambulatory Visit: Payer: Self-pay

## 2019-03-17 ENCOUNTER — Ambulatory Visit
Admission: RE | Admit: 2019-03-17 | Discharge: 2019-03-17 | Disposition: A | Discharge status: Home / Self Care | Payer: Federal, State, Local not specified - PPO | Source: Ambulatory Visit | Attending: Obstetrics and Gynecology | Admitting: Obstetrics and Gynecology

## 2019-03-17 DIAGNOSIS — Z1231 Encounter for screening mammogram for malignant neoplasm of breast: Secondary | ICD-10-CM

## 2019-03-18 ENCOUNTER — Encounter (INDEPENDENT_AMBULATORY_CARE_PROVIDER_SITE_OTHER): Payer: Self-pay | Admitting: Family Medicine

## 2019-03-18 ENCOUNTER — Ambulatory Visit (INDEPENDENT_AMBULATORY_CARE_PROVIDER_SITE_OTHER): Payer: Federal, State, Local not specified - PPO | Admitting: Physician Assistant

## 2019-03-18 ENCOUNTER — Ambulatory Visit (INDEPENDENT_AMBULATORY_CARE_PROVIDER_SITE_OTHER): Payer: Federal, State, Local not specified - PPO | Admitting: Family Medicine

## 2019-03-18 VITALS — BP 95/65 | HR 93 | Temp 97.9°F | Ht 62.0 in | Wt 183.0 lb

## 2019-03-18 DIAGNOSIS — Z6833 Body mass index (BMI) 33.0-33.9, adult: Secondary | ICD-10-CM | POA: Diagnosis not present

## 2019-03-18 DIAGNOSIS — E7849 Other hyperlipidemia: Secondary | ICD-10-CM

## 2019-03-18 DIAGNOSIS — E669 Obesity, unspecified: Secondary | ICD-10-CM

## 2019-03-18 DIAGNOSIS — R7303 Prediabetes: Secondary | ICD-10-CM

## 2019-03-22 NOTE — Progress Notes (Signed)
Chief Complaint:   OBESITY [name] is here to discuss her progress with her obesity treatment plan along with follow-up of her obesity related diagnoses. Krista Miles is on keeping a food journal and adhering to recommended goals of 1000-1200 calories and 70+ grams of protein daily and states she is following her eating plan approximately 90% of the time. Krista Miles states she is walking 3 miles 3 times per week.  Today's visit was #: 3 Starting weight: 184 lbs Starting date: 02/18/2019 Today's weight: 183 lbs Today's date: 03/18/2019 Total lbs lost to date: 1 Total lbs lost since last in-office visit: 0  Interim History: Krista Miles voices she felt she was losing weight, but did have indulgent dinner for her birthday 3 days ago. She is not keeping a food journal. She is doing eggs for breakfast and what sounds like a small dinner option for lunch. She is going to her mother's in the next 2 weeks and that may decrease her walking. She received her first COVID vaccine.  Subjective:   1. Pre-diabetes Krista Miles's last Hgb A1c was 5.8 and insulin of 14.3. She is not on medications. She notes carbohydrate cravings for starch.  2. Other hyperlipidemia Krista Miles's last LDL was 126, HDL of 59, and triglycerides of 111. She is on pravastatin, and she denies myalgias.  Assessment/Plan:   1. Pre-diabetes Krista Miles will continue to work on weight loss, exercise, and decreasing simple carbohydrates to help decrease the risk of diabetes. We will follow up on labs in 2 months.  2. Other hyperlipidemia Cardiovascular risk and specific lipid/LDL goals reviewed. We discussed several lifestyle modifications today and Krista Miles will continue to work on diet, exercise and weight loss efforts. We will follow up on FLP in 2 months. Orders and follow up as documented in patient record.   3. Class 1 obesity with serious comorbidity and body mass index (BMI) of 33.0 to 33.9 in adult, unspecified obesity  type Krista Miles is currently in the action stage of change. As such, her goal is to continue with weight loss efforts. She has agreed to the Category 2 Plan + 100 calories.   Exercise goals: As is.  Behavioral modification strategies: increasing lean protein intake, increasing vegetables, meal planning and cooking strategies, keeping healthy foods in the home and planning for success.  Krista Miles has agreed to follow-up with our clinic in 2 weeks. She was informed of the importance of frequent follow-up visits to maximize her success with intensive lifestyle modifications for her multiple health conditions.   Objective:   Blood pressure 95/65, pulse 93, temperature 97.9 F (36.6 C), temperature source Oral, height 5\' 2"  (1.575 m), weight 183 lb (83 kg), SpO2 98 %. Body mass index is 33.47 kg/m.  General: Cooperative, alert, well developed, in no acute distress. HEENT: Conjunctivae and lids unremarkable. Cardiovascular: Regular rhythm.  Lungs: Normal work of breathing. Neurologic: No focal deficits.   Lab Results  Component Value Date   CREATININE 0.76 02/18/2019   BUN 7 (L) 02/18/2019   NA 139 02/18/2019   K 4.3 02/18/2019   CL 102 02/18/2019   CO2 21 02/18/2019   Lab Results  Component Value Date   ALT 17 02/18/2019   AST 16 02/18/2019   ALKPHOS 104 02/18/2019   BILITOT 0.2 02/18/2019   Lab Results  Component Value Date   HGBA1C 5.8 (H) 01/25/2019   HGBA1C 5.8 (H) 06/02/2018   HGBA1C 5.7 05/01/2017   Lab Results  Component Value Date   INSULIN 14.3 02/18/2019  Lab Results  Component Value Date   TSH 0.577 02/18/2019   Lab Results  Component Value Date   CHOL 205 (H) 01/25/2019   HDL 59 01/25/2019   LDLCALC 126 (H) 01/25/2019   TRIG 111 01/25/2019   CHOLHDL 3.5 01/25/2019   Lab Results  Component Value Date   WBC 6.0 02/18/2019   HGB 12.9 02/18/2019   HCT 39.2 02/18/2019   MCV 93 02/18/2019   PLT 215 02/18/2019   No results found for: IRON, TIBC,  FERRITIN  Attestation Statements:   Reviewed by clinician on day of visit: allergies, medications, problem list, medical history, surgical history, family history, social history, and previous encounter notes.  Time spent on visit including pre-visit chart review and post-visit care and charting was 14 minutes.    I, Trixie Dredge, am acting as transcriptionist for Ilene Qua, MD.  I have reviewed the above documentation for accuracy and completeness, and I agree with the above. - Ilene Qua, MD

## 2019-04-06 ENCOUNTER — Ambulatory Visit (INDEPENDENT_AMBULATORY_CARE_PROVIDER_SITE_OTHER): Payer: Federal, State, Local not specified - PPO | Admitting: Family Medicine

## 2019-04-12 ENCOUNTER — Other Ambulatory Visit: Payer: Self-pay | Admitting: Internal Medicine

## 2019-04-15 ENCOUNTER — Other Ambulatory Visit: Payer: Self-pay | Admitting: Internal Medicine

## 2019-04-18 DIAGNOSIS — R21 Rash and other nonspecific skin eruption: Secondary | ICD-10-CM | POA: Diagnosis not present

## 2019-05-06 DIAGNOSIS — M810 Age-related osteoporosis without current pathological fracture: Secondary | ICD-10-CM | POA: Diagnosis not present

## 2019-05-11 ENCOUNTER — Other Ambulatory Visit: Payer: Self-pay | Admitting: Obstetrics and Gynecology

## 2019-06-01 DIAGNOSIS — M810 Age-related osteoporosis without current pathological fracture: Secondary | ICD-10-CM | POA: Diagnosis not present

## 2019-06-03 ENCOUNTER — Encounter: Payer: Federal, State, Local not specified - PPO | Admitting: Internal Medicine

## 2019-07-28 ENCOUNTER — Encounter: Payer: Self-pay | Admitting: Internal Medicine

## 2019-07-28 ENCOUNTER — Other Ambulatory Visit: Payer: Self-pay

## 2019-07-28 ENCOUNTER — Ambulatory Visit (INDEPENDENT_AMBULATORY_CARE_PROVIDER_SITE_OTHER): Payer: Federal, State, Local not specified - PPO | Admitting: Internal Medicine

## 2019-07-28 VITALS — BP 114/80 | HR 56 | Temp 97.8°F | Ht 61.4 in | Wt 183.0 lb

## 2019-07-28 DIAGNOSIS — Z6834 Body mass index (BMI) 34.0-34.9, adult: Secondary | ICD-10-CM | POA: Diagnosis not present

## 2019-07-28 DIAGNOSIS — Z79899 Other long term (current) drug therapy: Secondary | ICD-10-CM | POA: Diagnosis not present

## 2019-07-28 DIAGNOSIS — E7800 Pure hypercholesterolemia, unspecified: Secondary | ICD-10-CM

## 2019-07-28 DIAGNOSIS — R7309 Other abnormal glucose: Secondary | ICD-10-CM | POA: Diagnosis not present

## 2019-07-28 DIAGNOSIS — E559 Vitamin D deficiency, unspecified: Secondary | ICD-10-CM | POA: Diagnosis not present

## 2019-07-28 DIAGNOSIS — E6609 Other obesity due to excess calories: Secondary | ICD-10-CM | POA: Diagnosis not present

## 2019-07-28 DIAGNOSIS — Z0000 Encounter for general adult medical examination without abnormal findings: Secondary | ICD-10-CM | POA: Diagnosis not present

## 2019-07-28 NOTE — Progress Notes (Addendum)
I,Katawbba Wiggins,acting as a Education administrator for Maximino Greenland, MD.,have documented all relevant documentation on the behalf of Maximino Greenland, MD,as directed by  Maximino Greenland, MD while in the presence of Maximino Greenland, MD.  This visit occurred during the SARS-CoV-2 public health emergency.  Safety protocols were in place, including screening questions prior to the visit, additional usage of staff PPE, and extensive cleaning of exam room while observing appropriate contact time as indicated for disinfecting solutions.  Subjective:     Patient ID: Krista Miles , female    DOB: May 12, 1955 , 64 y.o.   MRN: [id number]   Chief Complaint  Patient presents with  . Annual Exam    HPI  She is here today for a full physical examination.  She is followed by Dr. Garwin Brothers for her pelvic exams.     Past Medical History:  Diagnosis Date  . Back pain   . Hyperlipidemia   . Prediabetes   . Vitamin D deficiency      Family History  Problem Relation Age of Onset  . Dementia Mother   . Cancer Father   . Leukemia Father   . Cancer Maternal Aunt      Current Outpatient Medications:  .  gabapentin (NEURONTIN) 300 MG capsule, TAKE 1 CAPSULE BY MOUTH TWICE A DAY, Disp: 180 capsule, Rfl: 1 .  pravastatin (PRAVACHOL) 40 MG tablet, TAKE 1 TABLET (40 MG TOTAL) BY MOUTH DAILY. PATIENT TAKING AS NEEDED (Patient taking differently: Take 40 mg by mouth daily. ), Disp: 90 tablet, Rfl: 1 .  triamcinolone cream (KENALOG) 0.1 %, Apply 1 application topically 2 (two) times daily as needed., Disp: , Rfl:    No Known Allergies    The patient states she uses post menopausal status for birth control. Last LMP was No LMP recorded. Patient is postmenopausal.. Negative for Dysmenorrhea. Negative for: breast discharge, breast lump(s), breast pain and breast self exam. Associated symptoms include abnormal vaginal bleeding. Pertinent negatives include abnormal bleeding (hematology), anxiety, decreased libido,  depression, difficulty falling sleep, dyspareunia, history of infertility, nocturia, sexual dysfunction, sleep disturbances, urinary incontinence, urinary urgency, vaginal discharge and vaginal itching. Diet regular.The patient states her exercise level is  intermittent.   . The patient's tobacco use is:  Social History   Tobacco Use  Smoking Status Never Smoker  Smokeless Tobacco Never Used  . She has been exposed to passive smoke. The patient's alcohol use is:  Social History   Substance and Sexual Activity  Alcohol Use No    Review of Systems  Constitutional: Negative.   HENT: Negative.   Eyes: Negative.   Respiratory: Negative.   Cardiovascular: Negative.   Gastrointestinal: Negative.   Endocrine: Negative.   Genitourinary: Negative.   Musculoskeletal: Negative.   Skin: Negative.   Allergic/Immunologic: Negative.   Neurological: Negative.   Hematological: Negative.   Psychiatric/Behavioral: Negative.      Today's Vitals   07/28/19 1220  BP: 114/80  Pulse: (!) 56  Temp: 97.8 F (36.6 C)  TempSrc: Oral  Weight: 183 lb (83 kg)  Height: 5' 1.4" (1.56 m)  PainSc: 0-No pain   Body mass index is 34.13 kg/m.  Wt Readings from Last 3 Encounters:  07/28/19 183 lb (83 kg)  03/18/19 183 lb (83 kg)  03/04/19 182 lb (82.6 kg)   Objective:  Physical Exam Constitutional:      General: She is not in acute distress.    Appearance: Normal appearance. She is well-developed. She is  obese.  HENT:     Head: Normocephalic and atraumatic.     Right Ear: Hearing, tympanic membrane, ear canal and external ear normal. There is no impacted cerumen.     Left Ear: Hearing, tympanic membrane, ear canal and external ear normal. There is no impacted cerumen.     Nose: Nose normal.     Mouth/Throat:     Mouth: Mucous membranes are dry.  Eyes:     General: Lids are normal.     Extraocular Movements: Extraocular movements intact.     Conjunctiva/sclera: Conjunctivae normal.      Pupils: Pupils are equal, round, and reactive to light.     Funduscopic exam:    Right eye: No papilledema.        Left eye: No papilledema.  Neck:     Thyroid: No thyroid mass.     Vascular: No carotid bruit.  Cardiovascular:     Rate and Rhythm: Normal rate and regular rhythm.     Pulses: Normal pulses.     Heart sounds: Normal heart sounds. No murmur heard.   Pulmonary:     Effort: Pulmonary effort is normal.     Breath sounds: Normal breath sounds.  Chest:     Breasts: Tanner Score is 5.        Right: Normal.        Left: Normal.  Abdominal:     General: Abdomen is flat. Bowel sounds are normal. There is no distension.     Palpations: Abdomen is soft.     Tenderness: There is no abdominal tenderness.  Genitourinary:    Rectum: Guaiac result negative.  Musculoskeletal:        General: No swelling. Normal range of motion.     Cervical back: Full passive range of motion without pain, normal range of motion and neck supple.     Right lower leg: No edema.     Left lower leg: No edema.  Skin:    General: Skin is warm and dry.     Capillary Refill: Capillary refill takes less than 2 seconds.  Neurological:     General: No focal deficit present.     Mental Status: She is alert and oriented to person, place, and time.     Cranial Nerves: No cranial nerve deficit.     Sensory: No sensory deficit.  Psychiatric:        Mood and Affect: Mood normal.        Behavior: Behavior normal.        Thought Content: Thought content normal.        Judgment: Judgment normal.         Assessment And Plan:     1. Routine general medical examination at health care facility  A full exam was performed. Importance of monthly SBE was discussed with the patient. PATIENT IS ADVISED TO GET 30-45 MINUTES REGULAR EXERCISE NO LESS THAN FOUR TO FIVE DAYS PER WEEK - BOTH WEIGHTBEARING EXERCISES AND AEROBIC ARE RECOMMENDED.  PATIENT IS ADVISED TO FOLLOW A HEALTHY DIET WITH AT LEAST SIX FRUITS/VEGGIES  PER DAY, DECREASE INTAKE OF RED MEAT, AND TO INCREASE FISH INTAKE TO TWO DAYS PER WEEK.  MEATS/FISH SHOULD NOT BE FRIED, BAKED OR BROILED IS PREFERABLE.  I SUGGEST WEARING SPF 50 SUNSCREEN ON EXPOSED PARTS AND ESPECIALLY WHEN IN THE DIRECT SUNLIGHT FOR AN EXTENDED PERIOD OF TIME.  PLEASE AVOID FAST FOOD RESTAURANTS AND INCREASE YOUR WATER INTAKE.  - Hemoglobin A1c - VITAMIN D  25 Hydroxy (Vit-D Deficiency, Fractures) - CBC - CMP14+EGFR - Lipid panel  2. Class 1 obesity due to excess calories with serious comorbidity and body mass index (BMI) of 34.0 to 34.9 in adult  She is encouraged to initially strive for BMI less than 30 to decrease cardiac risk. Advised to aim for at least 150 minutes of exercise per week.  We also discussed the use of Wegovy to address obesity. She confirms there is no personal/family h/o thyroid cancer. She was instructed on how to self administer the medication. Possible side effects including nausea, constipation and diarrhea were discussed with the patient. Its association with medullary thyroid carcinoma was also discussed. She is reminded to stop eating when full. She will start with 0.85m x 4 weeks, then 0.548mx 4 weeks, then 37m64m 4 weeks. All questions were answered to her satisfaction. She is in agreement with her treatment plan.   3. Pure hypercholesterolemia  Chronic. Last LDL reviewed, Jan 2021 - 128. Pt advised goal is less than 100. Encouraged to avoid fried foods and aim for at least 150 minutes of exercise per week. I will make further recommendations once her labs are available for review.    Patient was given opportunity to ask questions. Patient verbalized understanding of the plan and was able to repeat key elements of the plan. All questions were answered to their satisfaction.   RobMaximino GreenlandD   I, RobMaximino GreenlandD, have reviewed all documentation for this visit. The documentation on 08/02/19 for the exam, diagnosis, procedures, and orders  are all accurate and complete.  THE PATIENT IS ENCOURAGED TO PRACTICE SOCIAL DISTANCING DUE TO THE COVID-19 PANDEMIC.

## 2019-07-29 ENCOUNTER — Encounter: Payer: Federal, State, Local not specified - PPO | Admitting: Internal Medicine

## 2019-07-29 LAB — CMP14+EGFR
ALT: 15 IU/L (ref 0–32)
AST: 16 IU/L (ref 0–40)
Albumin/Globulin Ratio: 1.6 (ref 1.2–2.2)
Albumin: 4.7 g/dL (ref 3.8–4.8)
Alkaline Phosphatase: 84 IU/L (ref 48–121)
BUN/Creatinine Ratio: 11 — ABNORMAL LOW (ref 12–28)
BUN: 8 mg/dL (ref 8–27)
Bilirubin Total: 0.2 mg/dL (ref 0.0–1.2)
CO2: 25 mmol/L (ref 20–29)
Calcium: 9.2 mg/dL (ref 8.7–10.3)
Chloride: 105 mmol/L (ref 96–106)
Creatinine, Ser: 0.74 mg/dL (ref 0.57–1.00)
GFR calc Af Amer: 98 mL/min/{1.73_m2} (ref >59)
GFR calc non Af Amer: 85 mL/min/{1.73_m2} (ref >59)
Globulin, Total: 3 g/dL (ref 1.5–4.5)
Glucose: 94 mg/dL (ref 65–99)
Potassium: 4.4 mmol/L (ref 3.5–5.2)
Sodium: 145 mmol/L — ABNORMAL HIGH (ref 134–144)
Total Protein: 7.7 g/dL (ref 6.0–8.5)

## 2019-07-29 LAB — LIPID PANEL
Chol/HDL Ratio: 3.5 ratio (ref 0.0–4.4)
Cholesterol, Total: 218 mg/dL — ABNORMAL HIGH (ref 100–199)
HDL: 63 mg/dL (ref >39)
LDL Chol Calc (NIH): 136 mg/dL — ABNORMAL HIGH (ref 0–99)
Triglycerides: 110 mg/dL (ref 0–149)
VLDL Cholesterol Cal: 19 mg/dL (ref 5–40)

## 2019-07-29 LAB — CBC
Hematocrit: 42.5 % (ref 34.0–46.6)
Hemoglobin: 13.5 g/dL (ref 11.1–15.9)
MCH: 29 pg (ref 26.6–33.0)
MCHC: 31.8 g/dL (ref 31.5–35.7)
MCV: 91 fL (ref 79–97)
Platelets: 255 10*3/uL (ref 150–450)
RBC: 4.65 x10E6/uL (ref 3.77–5.28)
RDW: 12.9 % (ref 11.7–15.4)
WBC: 7 10*3/uL (ref 3.4–10.8)

## 2019-07-29 LAB — HEMOGLOBIN A1C
Est. average glucose Bld gHb Est-mCnc: 120 mg/dL
Hgb A1c MFr Bld: 5.8 % — ABNORMAL HIGH (ref 4.8–5.6)

## 2019-07-29 LAB — VITAMIN D 25 HYDROXY (VIT D DEFICIENCY, FRACTURES): Vit D, 25-Hydroxy: 88.8 ng/mL (ref 30.0–100.0)

## 2019-08-03 IMAGING — MG DIGITAL SCREENING BILATERAL MAMMOGRAM WITH CAD
5 series · 5 of 5 positions shown · non-contrast
Comparison: Previous exam(s).

CLINICAL DATA: Screening.

EXAM:
DIGITAL SCREENING BILATERAL MAMMOGRAM WITH CAD

[L CC]
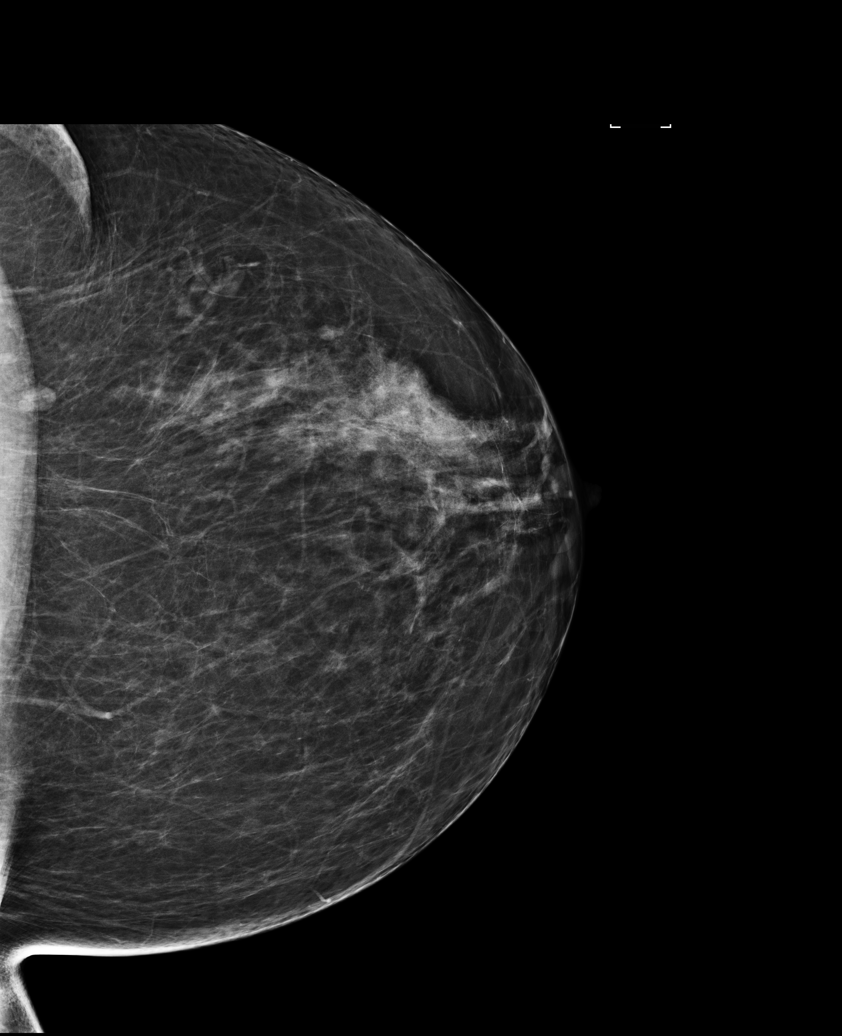

[L MLO]
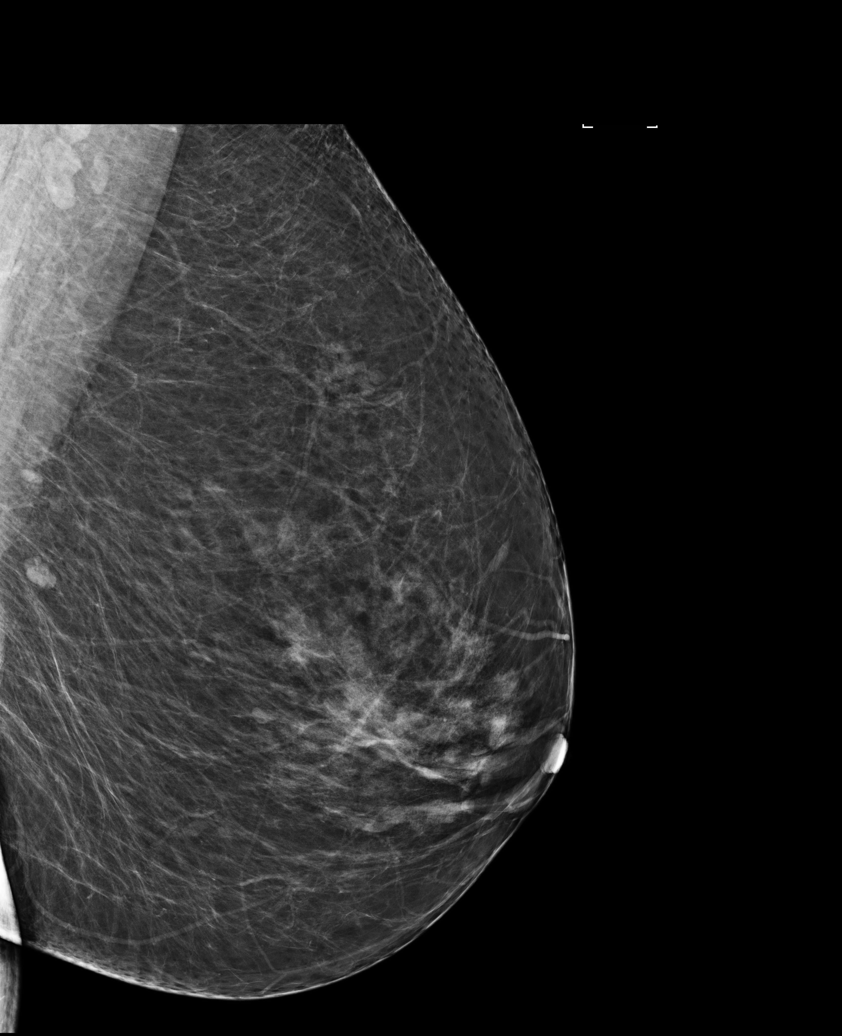

[R MLO (1 of 2)]
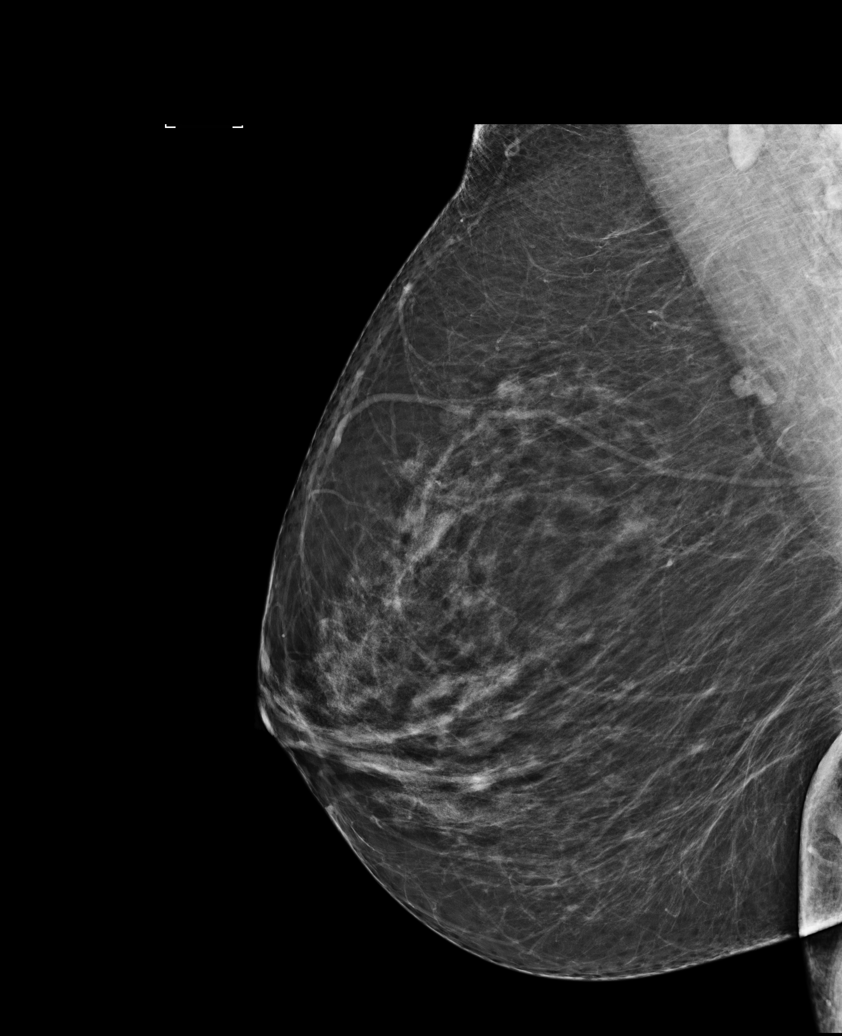

[R MLO (2 of 2)]
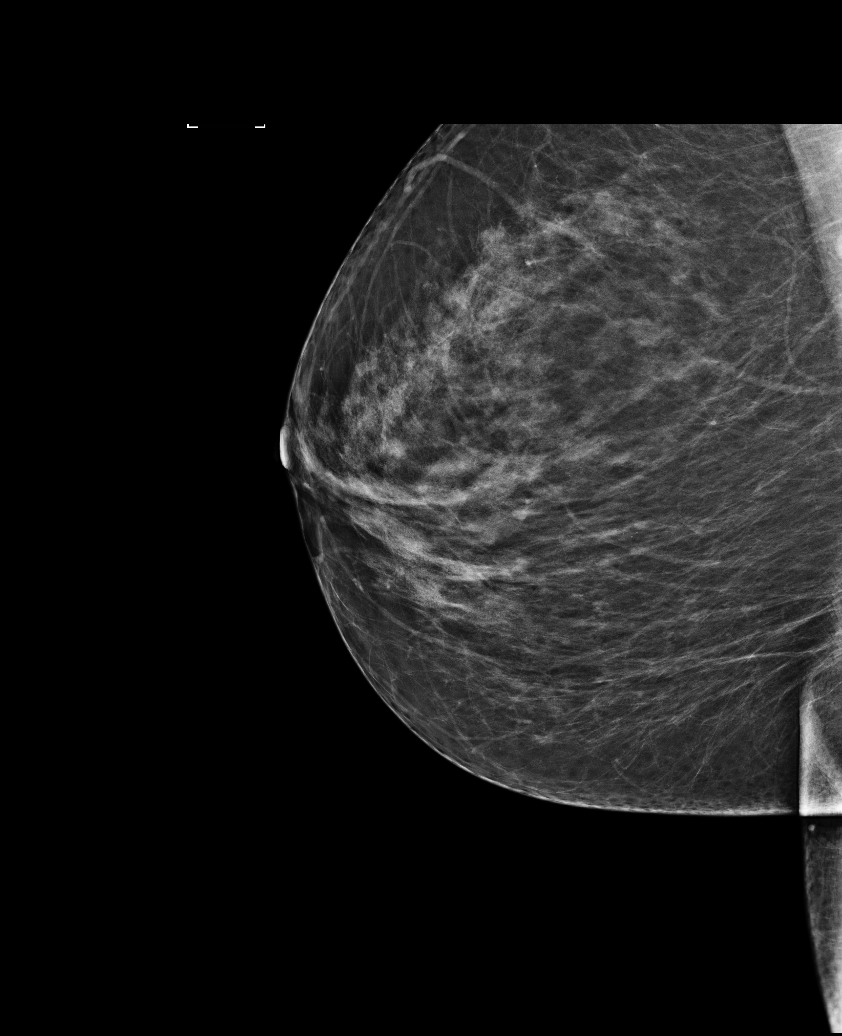

[R CC]
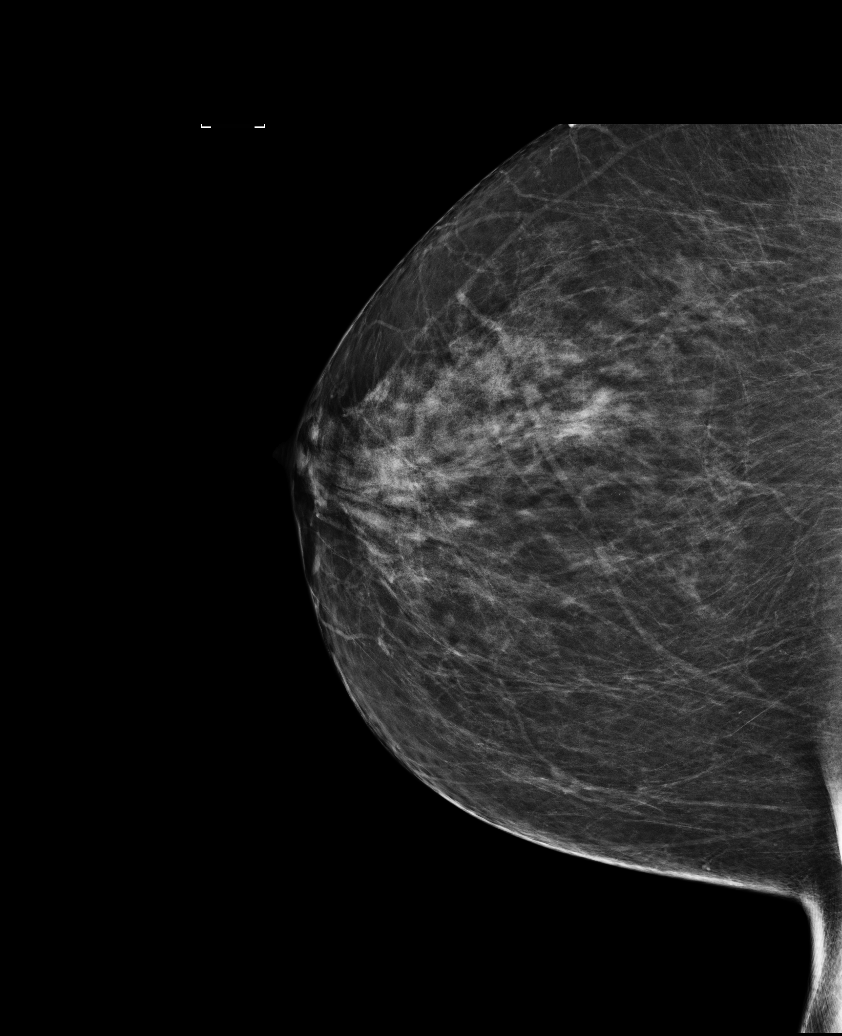

[5 of 5 positions shown; findings below may reference images not displayed]

ACR Breast Density Category b: There are scattered areas of
fibroglandular density.
FINDINGS: There are no findings suspicious for malignancy. Images were
processed with CAD.
IMPRESSION: No mammographic evidence of malignancy. A result letter of this
screening mammogram will be mailed directly to the patient.

RECOMMENDATION:
Screening mammogram in one year. (Code:AS-G-LCT)

BI-RADS CATEGORY  1: Negative.

## 2019-08-10 ENCOUNTER — Telehealth: Payer: Self-pay

## 2019-08-10 ENCOUNTER — Other Ambulatory Visit: Payer: Self-pay

## 2019-08-10 MED ORDER — WEGOVY 0.5 MG/0.5ML ~~LOC~~ SOAJ: 0.5000 mg | SUBCUTANEOUS | 0 refills | Status: DC

## 2019-08-10 NOTE — Telephone Encounter (Signed)
The pt was notified that her wegovy medication was [facility] to [facility].

## 2019-08-19 ENCOUNTER — Other Ambulatory Visit: Payer: Self-pay | Admitting: Internal Medicine

## 2019-09-17 ENCOUNTER — Other Ambulatory Visit: Payer: Self-pay

## 2019-09-17 MED ORDER — WEGOVY 0.5 MG/0.5ML ~~LOC~~ SOAJ: 0.5000 mg | SUBCUTANEOUS | 0 refills | Status: DC

## 2019-10-25 DIAGNOSIS — Z124 Encounter for screening for malignant neoplasm of cervix: Secondary | ICD-10-CM | POA: Diagnosis not present

## 2019-10-25 DIAGNOSIS — Z1231 Encounter for screening mammogram for malignant neoplasm of breast: Secondary | ICD-10-CM | POA: Diagnosis not present

## 2019-10-25 DIAGNOSIS — Z1211 Encounter for screening for malignant neoplasm of colon: Secondary | ICD-10-CM | POA: Diagnosis not present

## 2019-10-25 DIAGNOSIS — Z01419 Encounter for gynecological examination (general) (routine) without abnormal findings: Secondary | ICD-10-CM | POA: Diagnosis not present

## 2019-10-27 ENCOUNTER — Other Ambulatory Visit: Payer: Self-pay

## 2019-10-27 ENCOUNTER — Telehealth: Payer: Self-pay

## 2019-10-27 MED ORDER — WEGOVY 0.5 MG/0.5ML ~~LOC~~ SOAJ: 0.5000 mg | SUBCUTANEOUS | 0 refills | Status: DC

## 2019-10-27 NOTE — Telephone Encounter (Signed)
The pt was asked to confirm that she wanted to continue with the [facility] because it made her sick.  The pt said yes she wants to continue with the medication.

## 2019-11-06 ENCOUNTER — Other Ambulatory Visit: Payer: Self-pay | Admitting: Internal Medicine

## 2019-11-11 ENCOUNTER — Other Ambulatory Visit: Payer: Self-pay | Admitting: Internal Medicine

## 2019-11-23 DIAGNOSIS — E669 Obesity, unspecified: Secondary | ICD-10-CM | POA: Diagnosis not present

## 2019-11-23 DIAGNOSIS — Z1211 Encounter for screening for malignant neoplasm of colon: Secondary | ICD-10-CM | POA: Diagnosis not present

## 2019-11-23 DIAGNOSIS — Z800 Family history of malignant neoplasm of digestive organs: Secondary | ICD-10-CM | POA: Diagnosis not present

## 2019-11-29 DIAGNOSIS — K635 Polyp of colon: Secondary | ICD-10-CM | POA: Diagnosis not present

## 2019-11-29 DIAGNOSIS — D125 Benign neoplasm of sigmoid colon: Secondary | ICD-10-CM | POA: Diagnosis not present

## 2019-11-29 DIAGNOSIS — Z1211 Encounter for screening for malignant neoplasm of colon: Secondary | ICD-10-CM | POA: Diagnosis not present

## 2019-11-29 LAB — HM COLONOSCOPY

## 2019-11-30 ENCOUNTER — Encounter: Payer: Self-pay | Admitting: Internal Medicine

## 2019-12-06 ENCOUNTER — Other Ambulatory Visit: Payer: Self-pay

## 2019-12-06 MED ORDER — WEGOVY 0.5 MG/0.5ML ~~LOC~~ SOAJ: 0.5000 mg | SUBCUTANEOUS | 0 refills | Status: DC

## 2020-01-17 ENCOUNTER — Other Ambulatory Visit: Payer: Self-pay

## 2020-01-17 MED ORDER — WEGOVY 0.5 MG/0.5ML ~~LOC~~ SOAJ: 0.5000 mg | SUBCUTANEOUS | 0 refills | Status: DC

## 2020-01-17 NOTE — Telephone Encounter (Signed)
The pt was notified that she needed an appt every 8 wks for a weight follow-up to get future refills on her wegovy.  The pt didn't know and will keep her upcoming appt.

## 2020-01-27 ENCOUNTER — Other Ambulatory Visit: Payer: Self-pay | Admitting: Internal Medicine

## 2020-01-31 ENCOUNTER — Telehealth: Payer: Self-pay

## 2020-01-31 NOTE — Telephone Encounter (Signed)
The pt rescheduled her appt because she has come into contact with someone positive for covid.  The pt said that she didn't want to take the [facility] anymore anyway because it was $500.  The pt was told that she still needed a f/u on her cholesterol.

## 2020-02-02 ENCOUNTER — Ambulatory Visit: Payer: Federal, State, Local not specified - PPO | Admitting: Internal Medicine

## 2020-02-07 ENCOUNTER — Other Ambulatory Visit: Payer: Self-pay | Admitting: Obstetrics and Gynecology

## 2020-02-07 DIAGNOSIS — Z1231 Encounter for screening mammogram for malignant neoplasm of breast: Secondary | ICD-10-CM

## 2020-02-28 ENCOUNTER — Other Ambulatory Visit: Payer: Self-pay

## 2020-02-28 ENCOUNTER — Encounter: Payer: Self-pay | Admitting: Internal Medicine

## 2020-02-28 ENCOUNTER — Ambulatory Visit: Payer: Federal, State, Local not specified - PPO | Admitting: Internal Medicine

## 2020-02-28 VITALS — BP 116/72 | HR 101 | Temp 98.1°F | Ht 61.4 in | Wt 169.6 lb

## 2020-02-28 DIAGNOSIS — R7309 Other abnormal glucose: Secondary | ICD-10-CM

## 2020-02-28 DIAGNOSIS — E559 Vitamin D deficiency, unspecified: Secondary | ICD-10-CM | POA: Diagnosis not present

## 2020-02-28 DIAGNOSIS — Z6836 Body mass index (BMI) 36.0-36.9, adult: Secondary | ICD-10-CM

## 2020-02-28 DIAGNOSIS — E7800 Pure hypercholesterolemia, unspecified: Secondary | ICD-10-CM | POA: Diagnosis not present

## 2020-02-28 NOTE — Progress Notes (Signed)
I,Katawbba Wiggins,acting as a Education administrator for Maximino Greenland, MD.,have documented all relevant documentation on the behalf of Maximino Greenland, MD,as directed by  Maximino Greenland, MD while in the presence of Maximino Greenland, MD.  This visit occurred during the SARS-CoV-2 public health emergency.  Safety protocols were in place, including screening questions prior to the visit, additional usage of staff PPE, and extensive cleaning of exam room while observing appropriate contact time as indicated for disinfecting solutions.  Subjective:     Patient ID: Krista Miles , female    DOB: 07/23/1955 , 65 y.o.   MRN: [id number]   Chief Complaint  Patient presents with  . Hyperlipidemia  . vitamin d    HPI  She presents today for chol check. She is not taking any chol meds. She had been taking Wegovy for weight loss, but she is now unable to get it once savings card ran out (good for six months).   Hyperlipidemia This is a chronic problem. The problem is uncontrolled. Exacerbating diseases include obesity. She is currently on no antihyperlipidemic treatment.     Past Medical History:  Diagnosis Date  . Back pain   . Hyperlipidemia   . Prediabetes   . Vitamin D deficiency      Family History  Problem Relation Age of Onset  . Dementia Mother   . Cancer Father   . Leukemia Father   . Cancer Maternal Aunt      Current Outpatient Medications:  .  gabapentin (NEURONTIN) 300 MG capsule, TAKE 1 CAPSULE BY MOUTH TWICE A DAY, Disp: 180 capsule, Rfl: 1 .  pravastatin (PRAVACHOL) 40 MG tablet, TAKE 1 TABLET (40 MG TOTAL) BY MOUTH DAILY. PATIENT TAKING AS NEEDED (Patient not taking: Reported on 02/28/2020), Disp: 90 tablet, Rfl: 1 .  Semaglutide-Weight Management (WEGOVY) 0.5 MG/0.5ML SOAJ, Inject 0.5 mg into the skin once a week. (Patient not taking: Reported on 02/28/2020), Disp: 2.4 mL, Rfl: 0 .  triamcinolone cream (KENALOG) 0.1 %, Apply 1 application topically 2 (two) times daily as needed.  (Patient not taking: Reported on 02/28/2020), Disp: , Rfl:  .  Vitamin D, Ergocalciferol, (DRISDOL) 1.25 MG (50000 UNIT) CAPS capsule, TAKE 1 CAPSULE EVERY WEEK ON TUESDAYS AND THURSDAYS (Patient not taking: Reported on 02/28/2020), Disp: 24 capsule, Rfl: 0   No Known Allergies   Review of Systems  Constitutional: Negative.   Respiratory: Negative.   Cardiovascular: Negative.   Gastrointestinal: Negative.   Psychiatric/Behavioral: Negative.   All other systems reviewed and are negative.    Today's Vitals   02/28/20 1453  BP: 116/72  Pulse: (!) 101  Temp: 98.1 F (36.7 C)  TempSrc: Oral  Weight: 169 lb 9.6 oz (76.9 kg)  Height: 5' 1.4" (1.56 m)   Body mass index is 31.63 kg/m.  Wt Readings from Last 3 Encounters:  02/28/20 169 lb 9.6 oz (76.9 kg)  07/28/19 183 lb (83 kg)  03/18/19 183 lb (83 kg)   Objective:  Physical Exam Vitals and nursing note reviewed.  Constitutional:      Appearance: Normal appearance. She is obese.  HENT:     Head: Normocephalic and atraumatic.     Nose:     Comments: Masked     Mouth/Throat:     Comments: Masked  Cardiovascular:     Rate and Rhythm: Normal rate and regular rhythm.     Heart sounds: Normal heart sounds.  Pulmonary:     Effort: Pulmonary effort is normal.  Breath sounds: Normal breath sounds.  Musculoskeletal:     Cervical back: Normal range of motion.  Skin:    General: Skin is warm.  Neurological:     General: No focal deficit present.     Mental Status: She is alert.  Psychiatric:        Mood and Affect: Mood normal.        Behavior: Behavior normal.         Assessment And Plan:     1. Pure hypercholesterolemia Comments: Previous results reviewed. I will check at her next visit, she prefers to work on diet/exercise prior to starting meds. She will f/u July 2022 for CPE.   2. Other abnormal glucose Comments: Her a1c has been elevated in the past. I will recheck this today. Advised to limit her intake of  sugary beverages.  - BMP8+EGFR - Hemoglobin A1c  3. Vitamin D deficiency Comments: Previous results reviewed. Encoruaged to continue with current vit D supplementation.   4. Class 2 severe obesity due to excess calories with serious comorbidity and body mass index (BMI) of 36.0 to 36.9 in adult [facility]) Comments: She was congratulated on her 14 pound weight loss thus far. She is advised to aim for at least 150 minutes of exercise per week.   Patient was given opportunity to ask questions. Patient verbalized understanding of the plan and was able to repeat key elements of the plan. All questions were answered to their satisfaction.   I, Maximino Greenland, MD, have reviewed all documentation for this visit. The documentation on 03/05/20 for the exam, diagnosis, procedures, and orders are all accurate and complete.  THE PATIENT IS ENCOURAGED TO PRACTICE SOCIAL DISTANCING DUE TO THE COVID-19 PANDEMIC.

## 2020-02-28 NOTE — Patient Instructions (Signed)
High Cholesterol  High cholesterol is a condition in which the blood has high levels of a white, waxy substance similar to fat (cholesterol). The liver makes all the cholesterol that the body needs. The human body needs small amounts of cholesterol to help build cells. A person gets extra or excess cholesterol from the food that he or she eats. The blood carries cholesterol from the liver to the rest of the body. If you have high cholesterol, deposits (plaques) may build up on the walls of your arteries. Arteries are the blood vessels that carry blood away from your heart. These plaques make the arteries narrow and stiff. Cholesterol plaques increase your risk for heart attack and stroke. Work with your health care provider to keep your cholesterol levels in a healthy range. What increases the risk? The following factors may make you more likely to develop this condition:  Eating foods that are high in animal fat (saturated fat) or cholesterol.  Being overweight.  Not getting enough exercise.  A family history of high cholesterol (familial hypercholesterolemia).  Use of tobacco products.  Having diabetes. What are the signs or symptoms? There are no symptoms of this condition. How is this diagnosed? This condition may be diagnosed based on the results of a blood test.  If you are older than 66 years of age, your health care provider may check your cholesterol levels every 4-6 years.  You may be checked more often if you have high cholesterol or other risk factors for heart disease. The blood test for cholesterol measures:  "Bad" cholesterol, or LDL cholesterol. This is the main type of cholesterol that causes heart disease. The desired level is less than 100 mg/dL.  "Good" cholesterol, or HDL cholesterol. HDL helps protect against heart disease by cleaning the arteries and carrying the LDL to the liver for processing. The desired level for HDL is 60 mg/dL or higher.  Triglycerides.  These are fats that your body can store or burn for energy. The desired level is less than 150 mg/dL.  Total cholesterol. This measures the total amount of cholesterol in your blood and includes LDL, HDL, and triglycerides. The desired level is less than 200 mg/dL. How is this treated? This condition may be treated with:  Diet changes. You may be asked to eat foods that have more fiber and less saturated fats or added sugar.  Lifestyle changes. These may include regular exercise, maintaining a healthy weight, and quitting use of tobacco products.  Medicines. These are given when diet and lifestyle changes have not worked. You may be prescribed a statin medicine to help lower your cholesterol levels. Follow these instructions at home: Eating and drinking  Eat a healthy, balanced diet. This diet includes: ? Daily servings of a variety of fresh, frozen, or canned fruits and vegetables. ? Daily servings of whole grain foods that are rich in fiber. ? Foods that are low in saturated fats and trans fats. These include poultry and fish without skin, lean cuts of meat, and low-fat dairy products. ? A variety of fish, especially oily fish that contain omega-3 fatty acids. Aim to eat fish at least 2 times a week.  Avoid foods and drinks that have added sugar.  Use healthy cooking methods, such as roasting, grilling, broiling, baking, poaching, steaming, and stir-frying. Do not fry your food except for stir-frying.   Lifestyle  Get regular exercise. Aim to exercise for a total of 150 minutes a week. Increase your activity level by doing activities   such as gardening, walking, and taking the stairs.  Do not use any products that contain nicotine or tobacco, such as cigarettes, e-cigarettes, and chewing tobacco. If you need help quitting, ask your health care provider.   General instructions  Take over-the-counter and prescription medicines only as told by your health care provider.  Keep all  follow-up visits as told by your health care provider. This is important. Where to find more information  American Heart Association: www.heart.org  National Heart, Lung, and Blood Institute: www.nhlbi.nih.gov Contact a health care provider if:  You have trouble achieving or maintaining a healthy diet or weight.  You are starting an exercise program.  You are unable to stop smoking. Get help right away if:  You have chest pain.  You have trouble breathing.  You have any symptoms of a stroke. "BE FAST" is an easy way to remember the main warning signs of a stroke: ? B - Balance. Signs are dizziness, sudden trouble walking, or loss of balance. ? E - Eyes. Signs are trouble seeing or a sudden change in vision. ? F - Face. Signs are sudden weakness or numbness of the face, or the face or eyelid drooping on one side. ? A - Arms. Signs are weakness or numbness in an arm. This happens suddenly and usually on one side of the body. ? S - Speech. Signs are sudden trouble speaking, slurred speech, or trouble understanding what people say. ? T - Time. Time to call emergency services. Write down what time symptoms started.  You have other signs of a stroke, such as: ? A sudden, severe headache with no known cause. ? Nausea or vomiting. ? Seizure. These symptoms may represent a serious problem that is an emergency. Do not wait to see if the symptoms will go away. Get medical help right away. Call your local emergency services (911 in the [place] Do not drive yourself to the hospital. Summary  Cholesterol plaques increase your risk for heart attack and stroke. Work with your health care provider to keep your cholesterol levels in a healthy range.  Eat a healthy, balanced diet, get regular exercise, and maintain a healthy weight.  Do not use any products that contain nicotine or tobacco, such as cigarettes, e-cigarettes, and chewing tobacco.  Get help right away if you have any symptoms of a  stroke. This information is not intended to replace advice given to you by your health care provider. Make sure you discuss any questions you have with your health care provider. Document Revised: 11/23/2018 Document Reviewed: 11/23/2018 Elsevier Patient Education  2021 [employer]  

## 2020-02-29 LAB — BMP8+EGFR
BUN/Creatinine Ratio: 19 (ref 12–28)
BUN: 15 mg/dL (ref 8–27)
CO2: 22 mmol/L (ref 20–29)
Calcium: 9.3 mg/dL (ref 8.7–10.3)
Chloride: 104 mmol/L (ref 96–106)
Creatinine, Ser: 0.79 mg/dL (ref 0.57–1.00)
GFR calc Af Amer: 91 mL/min/{1.73_m2} (ref >59)
GFR calc non Af Amer: 79 mL/min/{1.73_m2} (ref >59)
Glucose: 87 mg/dL (ref 65–99)
Potassium: 4.5 mmol/L (ref 3.5–5.2)
Sodium: 142 mmol/L (ref 134–144)

## 2020-02-29 LAB — HEMOGLOBIN A1C
Est. average glucose Bld gHb Est-mCnc: 114 mg/dL
Hgb A1c MFr Bld: 5.6 % (ref 4.8–5.6)

## 2020-03-20 ENCOUNTER — Other Ambulatory Visit: Payer: Self-pay | Admitting: Internal Medicine

## 2020-03-22 ENCOUNTER — Ambulatory Visit: Payer: Federal, State, Local not specified - PPO

## 2020-04-10 ENCOUNTER — Other Ambulatory Visit: Payer: Self-pay

## 2020-04-10 MED ORDER — WEGOVY 0.5 MG/0.5ML ~~LOC~~ SOAJ: 0.5000 mg | SUBCUTANEOUS | 0 refills | Status: DC

## 2020-04-10 NOTE — Telephone Encounter (Signed)
The pt called and left a message for her refill of Wegovy to go to [facility].

## 2020-04-27 ENCOUNTER — Telehealth: Payer: Self-pay

## 2020-04-27 NOTE — Telephone Encounter (Signed)
The pt confirmed that she is still taking the weogvy medication.

## 2020-05-03 ENCOUNTER — Other Ambulatory Visit: Payer: Self-pay

## 2020-05-03 MED ORDER — WEGOVY 0.5 MG/0.5ML ~~LOC~~ SOAJ: 0.5000 mg | SUBCUTANEOUS | 0 refills | Status: DC

## 2020-05-08 ENCOUNTER — Other Ambulatory Visit: Payer: Self-pay

## 2020-05-08 ENCOUNTER — Encounter: Payer: Self-pay | Admitting: Internal Medicine

## 2020-05-08 ENCOUNTER — Ambulatory Visit: Payer: Federal, State, Local not specified - PPO | Admitting: Internal Medicine

## 2020-05-08 VITALS — BP 116/80 | HR 87 | Temp 98.0°F | Ht 61.0 in | Wt 169.0 lb

## 2020-05-08 DIAGNOSIS — E6609 Other obesity due to excess calories: Secondary | ICD-10-CM

## 2020-05-08 DIAGNOSIS — G5603 Carpal tunnel syndrome, bilateral upper limbs: Secondary | ICD-10-CM | POA: Diagnosis not present

## 2020-05-08 DIAGNOSIS — Z6831 Body mass index (BMI) 31.0-31.9, adult: Secondary | ICD-10-CM | POA: Diagnosis not present

## 2020-05-08 MED ORDER — WEGOVY 1 MG/0.5ML ~~LOC~~ SOAJ: 1.0000 mg | SUBCUTANEOUS | 0 refills | Status: DC

## 2020-05-08 MED ORDER — GABAPENTIN 300 MG PO CAPS: 1.0000 | ORAL_CAPSULE | Freq: Two times a day (BID) | ORAL | 1 refills | Status: DC

## 2020-05-08 NOTE — Progress Notes (Signed)
Earleen Newport as a Education administrator for Maximino Greenland, MD.,have documented all relevant documentation on the behalf of Maximino Greenland, MD,as directed by  Maximino Greenland, MD while in the presence of Maximino Greenland, MD. This visit occurred during the SARS-CoV-2 public health emergency.  Safety protocols were in place, including screening questions prior to the visit, additional usage of staff PPE, and extensive cleaning of exam room while observing appropriate contact time as indicated for disinfecting solutions.  Subjective:     Patient ID: Krista Miles , female    DOB: [birth year] , 65 y.o.   MRN: [id number]   Chief Complaint  Patient presents with  . Weight Check    HPI  Pt presents today for weight check she has been taking Wegovy for weight loss, she reports compliance w/ meds. She has not had any issues with the medication.    Past Medical History:  Diagnosis Date  . Back pain   . Hyperlipidemia   . Prediabetes   . Vitamin D deficiency      Family History  Problem Relation Age of Onset  . Dementia Mother   . Cancer Father   . Leukemia Father   . Cancer Maternal Aunt      Current Outpatient Medications:  .  Semaglutide-Weight Management (WEGOVY) 1 MG/0.5ML SOAJ, Inject 1 mg into the skin once a week., Disp: 2 mL, Rfl: 0 .  Vitamin D, Ergocalciferol, (DRISDOL) 1.25 MG (50000 UNIT) CAPS capsule, TAKE 1 CAPSULE EVERY WEEK ON TUESDAYS AND THURSDAYS, Disp: 24 capsule, Rfl: 0 .  gabapentin (NEURONTIN) 300 MG capsule, Take 1 capsule (300 mg total) by mouth 2 (two) times daily., Disp: 180 capsule, Rfl: 1 .  pravastatin (PRAVACHOL) 40 MG tablet, TAKE 1 TABLET (40 MG TOTAL) BY MOUTH DAILY. PATIENT TAKING AS NEEDED (Patient not taking: No sig reported), Disp: 90 tablet, Rfl: 1 .  Semaglutide-Weight Management (WEGOVY) 0.5 MG/0.5ML SOAJ, Inject 0.5 mg into the skin once a week., Disp: 2 mL, Rfl: 1 .  triamcinolone cream (KENALOG) 0.1 %, Apply 1 application topically 2 (two)  times daily as needed. (Patient not taking: No sig reported), Disp: , Rfl:    No Known Allergies   Review of Systems  Constitutional: Negative.   Respiratory: Negative.   Cardiovascular: Negative.   Neurological: Negative.   Psychiatric/Behavioral: Negative.      Today's Vitals   05/08/20 1201  BP: 116/80  Pulse: 87  Temp: 98 F (36.7 C)  SpO2: 99%  Weight: 169 lb (76.7 kg)  Height: 5\' 1"  (1.549 m)   Body mass index is 31.93 kg/m.   Wt Readings from Last 3 Encounters:  05/08/20 169 lb (76.7 kg)  02/28/20 169 lb 9.6 oz (76.9 kg)  07/28/19 183 lb (83 kg)    Physical Exam Vitals and nursing note reviewed.  Constitutional:      Appearance: Normal appearance.  HENT:     Head: Normocephalic and atraumatic.     Nose:     Comments: Masked     Mouth/Throat:     Comments: Masked  Cardiovascular:     Rate and Rhythm: Normal rate and regular rhythm.     Heart sounds: Normal heart sounds.  Pulmonary:     Effort: Pulmonary effort is normal.     Breath sounds: Normal breath sounds.  Skin:    General: Skin is warm.  Neurological:     General: No focal deficit present.     Mental Status: She is  alert.  Psychiatric:        Mood and Affect: Mood normal.        Behavior: Behavior normal.         Assessment And Plan:     1. Class 1 obesity due to excess calories without serious comorbidity with body mass index (BMI) of 31.0 to 31.9 in adult Comments: She has not lost any weight since her last visit. Advised to aim for at least 150 minutes of exercise per week. I will send rx Wegovy 0.5, f/u in 8 weeks.  2. Bilateral carpal tunnel syndrome Comments: She was given refill of gabapentin.    Patient was given opportunity to ask questions. Patient verbalized understanding of the plan and was able to repeat key elements of the plan. All questions were answered to their satisfaction.   I, Maximino Greenland, MD, have reviewed all documentation for this visit. The documentation on  05/08/20 for the exam, diagnosis, procedures, and orders are all accurate and complete.   IF YOU HAVE BEEN REFERRED TO A SPECIALIST, IT MAY TAKE 1-2 WEEKS TO SCHEDULE/PROCESS THE REFERRAL. IF YOU HAVE NOT HEARD FROM US/SPECIALIST IN TWO WEEKS, PLEASE GIVE [place] A CALL AT [phone].   THE PATIENT IS ENCOURAGED TO PRACTICE SOCIAL DISTANCING DUE TO THE COVID-19 PANDEMIC.

## 2020-05-09 ENCOUNTER — Other Ambulatory Visit: Payer: Self-pay

## 2020-05-09 MED ORDER — WEGOVY 0.5 MG/0.5ML ~~LOC~~ SOAJ: 0.5000 mg | SUBCUTANEOUS | 1 refills | Status: DC

## 2020-05-09 NOTE — Telephone Encounter (Signed)
The pt was notified that the pharmacy ran the 1mg  and the 0.5 mg of wegovy against her insurance and it is over $1000.  The pt requested that the rx for the 0.5 mg be sent to the pharmacy.

## 2020-05-12 ENCOUNTER — Ambulatory Visit
Admission: RE | Admit: 2020-05-12 | Discharge: 2020-05-12 | Disposition: A | Discharge status: Home / Self Care | Payer: Federal, State, Local not specified - PPO | Source: Ambulatory Visit | Attending: Obstetrics and Gynecology | Admitting: Obstetrics and Gynecology

## 2020-05-12 ENCOUNTER — Other Ambulatory Visit: Payer: Self-pay

## 2020-05-12 DIAGNOSIS — Z1231 Encounter for screening mammogram for malignant neoplasm of breast: Secondary | ICD-10-CM | POA: Diagnosis not present

## 2020-05-22 ENCOUNTER — Encounter: Payer: Self-pay | Admitting: Internal Medicine

## 2020-05-24 ENCOUNTER — Other Ambulatory Visit: Payer: Self-pay | Admitting: Internal Medicine

## 2020-05-26 DIAGNOSIS — Z20822 Contact with and (suspected) exposure to covid-19: Secondary | ICD-10-CM | POA: Diagnosis not present

## 2020-06-04 ENCOUNTER — Other Ambulatory Visit: Payer: Self-pay | Admitting: Internal Medicine

## 2020-06-07 ENCOUNTER — Ambulatory Visit: Payer: Federal, State, Local not specified - PPO | Admitting: Internal Medicine

## 2020-06-07 ENCOUNTER — Other Ambulatory Visit: Payer: Self-pay

## 2020-06-07 ENCOUNTER — Encounter: Payer: Self-pay | Admitting: Internal Medicine

## 2020-06-07 VITALS — BP 124/76 | HR 62 | Temp 98.3°F | Ht 61.0 in | Wt 166.0 lb

## 2020-06-07 DIAGNOSIS — Z6831 Body mass index (BMI) 31.0-31.9, adult: Secondary | ICD-10-CM

## 2020-06-07 DIAGNOSIS — E6609 Other obesity due to excess calories: Secondary | ICD-10-CM | POA: Diagnosis not present

## 2020-06-07 DIAGNOSIS — R5383 Other fatigue: Secondary | ICD-10-CM | POA: Diagnosis not present

## 2020-06-07 DIAGNOSIS — E7800 Pure hypercholesterolemia, unspecified: Secondary | ICD-10-CM

## 2020-06-07 DIAGNOSIS — Z8616 Personal history of COVID-19: Secondary | ICD-10-CM

## 2020-06-07 MED ORDER — WEGOVY 0.5 MG/0.5ML ~~LOC~~ SOAJ: 0.5000 mg | SUBCUTANEOUS | 1 refills | Status: DC

## 2020-06-07 NOTE — Patient Instructions (Addendum)

## 2020-06-07 NOTE — Progress Notes (Signed)
I,Katawbba Wiggins,acting as a Education administrator for Maximino Greenland, MD.,have documented all relevant documentation on the behalf of Maximino Greenland, MD,as directed by  Maximino Greenland, MD while in the presence of Maximino Greenland, MD.  This visit occurred during the SARS-CoV-2 public health emergency.  Safety protocols were in place, including screening questions prior to the visit, additional usage of staff PPE, and extensive cleaning of exam room while observing appropriate contact time as indicated for disinfecting solutions.  Subjective:     Patient ID: Krista Miles , female    DOB: [birth year] , 65 y.o.   MRN: [id number]   Chief Complaint  Patient presents with   Hyperlipidemia   Obesity    HPI  The patient is here today for a follow-up on her weight and her cholesterol. She was prescribed pravastatin to treat hyperlipidemia. Admits that she is not taking as prescribed.  Unfortunately, her insurance does not cover [phone]. She has been unable to fill the rx once the savings card ran out.     Past Medical History:  Diagnosis Date   Back pain    Hyperlipidemia    Prediabetes    Vitamin D deficiency      Family History  Problem Relation Age of Onset   Dementia Mother    Cancer Father    Leukemia Father    Cancer Maternal Aunt      Current Outpatient Medications:    gabapentin (NEURONTIN) 300 MG capsule, Take 1 capsule (300 mg total) by mouth 2 (two) times daily., Disp: 180 capsule, Rfl: 1   pravastatin (PRAVACHOL) 40 MG tablet, TAKE 1 TABLET (40 MG TOTAL) BY MOUTH DAILY. PATIENT TAKING AS NEEDED, Disp: 90 tablet, Rfl: 1   Vitamin D, Ergocalciferol, (DRISDOL) 1.25 MG (50000 UNIT) CAPS capsule, TAKE 1 CAPSULE EVERY WEEK ON TUESDAYS AND THURSDAYS, Disp: 24 capsule, Rfl: 0   Semaglutide-Weight Management (WEGOVY) 0.5 MG/0.5ML SOAJ, Inject 0.5 mg into the skin once a week., Disp: 2 mL, Rfl: 1   No Known Allergies   Review of Systems  Constitutional:  Positive for fatigue.        States she is still experiencing fatigue after having COVID. Makes it difficult for her to get through the work day.  Respiratory: Negative.    Cardiovascular: Negative.   Gastrointestinal: Negative.   Psychiatric/Behavioral: Negative.    All other systems reviewed and are negative.   Today's Vitals   06/07/20 1447  BP: 124/76  Pulse: 62  Temp: 98.3 F (36.8 C)  TempSrc: Oral  Weight: 166 lb (75.3 kg)  Height: 5\' 1"  (1.549 m)   Body mass index is 31.37 kg/m.  Wt Readings from Last 3 Encounters:  06/07/20 166 lb (75.3 kg)  05/08/20 169 lb (76.7 kg)  02/28/20 169 lb 9.6 oz (76.9 kg)   BP Readings from Last 3 Encounters:  06/07/20 124/76  05/08/20 116/80  02/28/20 116/72   Objective:  Physical Exam Vitals and nursing note reviewed.  Constitutional:      Appearance: Normal appearance.  HENT:     Head: Normocephalic and atraumatic.     Nose:     Comments: Masked     Mouth/Throat:     Comments: Masked  Cardiovascular:     Rate and Rhythm: Normal rate and regular rhythm.     Heart sounds: Normal heart sounds.  Pulmonary:     Effort: Pulmonary effort is normal.     Breath sounds: Normal breath sounds.  Musculoskeletal:  Cervical back: Normal range of motion.  Skin:    General: Skin is warm.  Neurological:     General: No focal deficit present.     Mental Status: She is alert.  Psychiatric:        Mood and Affect: Mood normal.        Behavior: Behavior normal.        Assessment And Plan:     1. Pure hypercholesterolemia Comments: Chronic, I will check lipid panel at next visit. Advised to take meds as prescribed. She will f/u in six months.   2. Class 1 obesity due to excess calories without serious comorbidity with body mass index (BMI) of 31.0 to 31.9 in adult Comments: She is encouraged to gradually increase her daily actiivty working up to 30 minutes five days per week.   3. Fatigue, unspecified type Comments: Likely related to post-COVID sx.  Encouraged to stay well hydrated and start melatonin nightly. I will write note to allow pt to work from home 2-3x/wk.   4. Personal history of COVID-19 Comments: She is fully vaccinated and boosted x 1.   Patient was given opportunity to ask questions. Patient verbalized understanding of the plan and was able to repeat key elements of the plan. All questions were answered to their satisfaction.   I, Maximino Greenland, MD, have reviewed all documentation for this visit. The documentation on 06/07/20 for the exam, diagnosis, procedures, and orders are all accurate and complete.   IF YOU HAVE BEEN REFERRED TO A SPECIALIST, IT MAY TAKE 1-2 WEEKS TO SCHEDULE/PROCESS THE REFERRAL. IF YOU HAVE NOT HEARD FROM US/SPECIALIST IN TWO WEEKS, PLEASE GIVE [place] A CALL AT [phone].   THE PATIENT IS ENCOURAGED TO PRACTICE SOCIAL DISTANCING DUE TO THE COVID-19 PANDEMIC.

## 2020-06-15 ENCOUNTER — Telehealth: Payer: Self-pay

## 2020-06-15 NOTE — Telephone Encounter (Signed)
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 The pt said she will pickup the original.

## 2020-08-02 ENCOUNTER — Encounter: Payer: Federal, State, Local not specified - PPO | Admitting: Internal Medicine

## 2020-08-02 NOTE — Patient Instructions (Signed)
Health Maintenance, Female Adopting a healthy lifestyle and getting preventive care are important in promoting health and wellness. Ask your health care provider about: The right schedule for you to have regular tests and exams. Things you can do on your own to prevent diseases and keep yourself healthy. What should I know about diet, weight, and exercise? Eat a healthy diet  Eat a diet that includes plenty of vegetables, fruits, low-fat dairy products, and lean protein. Do not eat a lot of foods that are high in solid fats, added sugars, or sodium.  Maintain a healthy weight Body mass index (BMI) is used to identify weight problems. It estimates body fat based on height and weight. Your health care provider can help determineyour BMI and help you achieve or maintain a healthy weight. Get regular exercise Get regular exercise. This is one of the most important things you can do for your health. Most adults should: Exercise for at least 150 minutes each week. The exercise should increase your heart rate and make you sweat (moderate-intensity exercise). Do strengthening exercises at least twice a week. This is in addition to the moderate-intensity exercise. Spend less time sitting. Even light physical activity can be beneficial. Watch cholesterol and blood lipids Have your blood tested for lipids and cholesterol at 66 years of age, then havethis test every 5 years. Have your cholesterol levels checked more often if: Your lipid or cholesterol levels are high. You are older than 66 years of age. You are at high risk for heart disease. What should I know about cancer screening? Depending on your health history and family history, you may need to have cancer screening at various ages. This may include screening for: Breast cancer. Cervical cancer. Colorectal cancer. Skin cancer. Lung cancer. What should I know about heart disease, diabetes, and high blood pressure? Blood pressure and heart  disease High blood pressure causes heart disease and increases the risk of stroke. This is more likely to develop in people who have high blood pressure readings, are of African descent, or are overweight. Have your blood pressure checked: Every 3-5 years if you are 18-39 years of age. Every year if you are 40 years old or older. Diabetes Have regular diabetes screenings. This checks your fasting blood sugar level. Have the screening done: Once every three years after age 40 if you are at a normal weight and have a low risk for diabetes. More often and at a younger age if you are overweight or have a high risk for diabetes. What should I know about preventing infection? Hepatitis B If you have a higher risk for hepatitis B, you should be screened for this virus. Talk with your health care provider to find out if you are at risk forhepatitis B infection. Hepatitis C Testing is recommended for: Everyone born from 1945 through 1965. Anyone with known risk factors for hepatitis C. Sexually transmitted infections (STIs) Get screened for STIs, including gonorrhea and chlamydia, if: You are sexually active and are younger than 66 years of age. You are older than 66 years of age and your health care provider tells you that you are at risk for this type of infection. Your sexual activity has changed since you were last screened, and you are at increased risk for chlamydia or gonorrhea. Ask your health care provider if you are at risk. Ask your health care provider about whether you are at high risk for HIV. Your health care provider may recommend a prescription medicine to help   prevent HIV infection. If you choose to take medicine to prevent HIV, you should first get tested for HIV. You should then be tested every 3 months for as long as you are taking the medicine. Pregnancy If you are about to stop having your period (premenopausal) and you may become pregnant, seek counseling before you get  pregnant. Take 400 to 800 micrograms (mcg) of folic acid every day if you become pregnant. Ask for birth control (contraception) if you want to prevent pregnancy. Osteoporosis and menopause Osteoporosis is a disease in which the bones lose minerals and strength with aging. This can result in bone fractures. If you are 65 years old or older, or if you are at risk for osteoporosis and fractures, ask your health care provider if you should: Be screened for bone loss. Take a calcium or vitamin D supplement to lower your risk of fractures. Be given hormone replacement therapy (HRT) to treat symptoms of menopause. Follow these instructions at home: Lifestyle Do not use any products that contain nicotine or tobacco, such as cigarettes, e-cigarettes, and chewing tobacco. If you need help quitting, ask your health care provider. Do not use street drugs. Do not share needles. Ask your health care provider for help if you need support or information about quitting drugs. Alcohol use Do not drink alcohol if: Your health care provider tells you not to drink. You are pregnant, may be pregnant, or are planning to become pregnant. If you drink alcohol: Limit how much you use to 0-1 drink a day. Limit intake if you are breastfeeding. Be aware of how much alcohol is in your drink. In the [place] one drink equals one 12 oz bottle of beer (355 mL), one 5 oz glass of wine (148 mL), or one 1 oz glass of hard liquor (44 mL). General instructions Schedule regular health, dental, and eye exams. Stay current with your vaccines. Tell your health care provider if: You often feel depressed. You have ever been abused or do not feel safe at home. Summary Adopting a healthy lifestyle and getting preventive care are important in promoting health and wellness. Follow your health care provider's instructions about healthy diet, exercising, and getting tested or screened for diseases. Follow your health care provider's  instructions on monitoring your cholesterol and blood pressure. This information is not intended to replace advice given to you by your health care provider. Make sure you discuss any questions you have with your healthcare provider. Document Revised: 12/17/2017 Document Reviewed: 12/17/2017 Elsevier Patient Education  2022 Elsevier Inc.  

## 2020-08-02 NOTE — Progress Notes (Signed)
Pt did not show/cancelled. Erroneous

## 2020-08-16 ENCOUNTER — Ambulatory Visit: Payer: Self-pay

## 2020-08-16 ENCOUNTER — Other Ambulatory Visit: Payer: Self-pay

## 2020-08-16 ENCOUNTER — Ambulatory Visit (INDEPENDENT_AMBULATORY_CARE_PROVIDER_SITE_OTHER): Payer: Federal, State, Local not specified - PPO | Admitting: Orthopedic Surgery

## 2020-08-16 DIAGNOSIS — M79641 Pain in right hand: Secondary | ICD-10-CM

## 2020-08-16 DIAGNOSIS — M79642 Pain in left hand: Secondary | ICD-10-CM | POA: Diagnosis not present

## 2020-08-16 NOTE — Progress Notes (Signed)
Office Visit Note   Patient: Krista Miles           Date of Birth: [birth year]           MRN: [id number] Visit Date: 08/16/2020 Requested by: Glendale Chard, [address] [place],  [place] [address] PCP: Glendale Chard, MD  Subjective: Chief Complaint  Patient presents with   Other    Bilateral hand/wrist pain Wanting her case reopened with [employer]    HPI: Krista Miles is a 64 year old patient with bilateral hand pain.  She is had carpal tunnel release on both sides and first dorsal compartment release on another.  Her [employer] case has been closed from 2015.  She was on modified duty which she could do.  Current duty level gives her significant symptoms regarding her hands in terms of tendinitis and overuse symptoms on both the palmar and dorsal surface.  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              ROS: All systems reviewed are negative as they relate to the chief complaint within the history of present illness.  Patient denies  fevers or chills.   Assessment & Plan: Visit Diagnoses:  1. Bilateral hand pain     Plan: Impression is bilateral hand pain which is mild.  Do not think she has any type of recurrence of carpal tunnel syndrome.  Grip strength is pretty reasonable and she has good range of motion.  Does have little bit of tenderness over the first dorsal compartments bilaterally.  In general I think that she would do well to get back to her duty level where she was after the surgeries.  A lot of this repetitive wrist motion with combinations of flexion extension pronation and supination in a repetitive manner is pretty likely to aggravate her pre-existing condition she will follow-up as needed  Follow-Up Instructions: Return if symptoms worsen or fail to improve.   Orders:  Orders Placed This Encounter  Procedures   XR Hand Complete Right   XR Hand Complete Left   No orders of the  defined types were placed in this encounter.     Procedures: No procedures performed   Clinical Data: No additional findings.  Objective: Vital Signs: There were no vitals taken for this visit.  Physical Exam:   Constitutional: Patient appears well-developed HEENT:  Head: Normocephalic Eyes:EOM are normal Neck: Normal range of motion Cardiovascular: Normal rate Pulmonary/chest: Effort normal Neurologic: Patient is alert Skin: Skin is warm Psychiatric: Patient has normal mood and affect   Ortho Exam: Ortho exam demonstrates good symmetric grip strength bilaterally.  Mild tenderness over the first dorsal compartment bilaterally.  All surgical incisions are well-healed.  No abductor pollicis brevis wasting.  Wrist range of motion pronation supination flexion extension intact with no crepitus or popping in the ulnar side of either wrist.  DRUJ stable.  No snuffbox tenderness.  Specialty Comments:  No specialty comments available.  Imaging: No results found.   PMFS History: Patient Active Problem List   Diagnosis Date Noted   Acute left-sided low back pain without sciatica 12/07/2017   Urinary frequency 12/07/2017   Hyperlipidemia 10/28/2017   Abnormal glucose 10/28/2017   Past Medical History:  Diagnosis Date   Back pain    Hyperlipidemia    Prediabetes    Vitamin D deficiency     Family History  Problem Relation Age of Onset  Dementia Mother    Cancer Father    Leukemia Father    Cancer Maternal Aunt     Past Surgical History:  Procedure Laterality Date   CESAREAN SECTION     HAND SURGERY Bilateral 2016   TRIGGER FINGER RELEASE Left 10/04/2017   Dr. Alphonzo Severance   TRIGGER FINGER RELEASE Right 05/06/2018   right ring finger   Social History   Occupational History   Occupation: [employer]  Tobacco Use   Smoking status: Never   Smokeless tobacco: Never  Vaping Use   Vaping Use: Never used  Substance and Sexual Activity   Alcohol use: No   Drug use: No    Sexual activity: Not on file

## 2020-08-20 ENCOUNTER — Encounter: Payer: Self-pay | Admitting: Orthopedic Surgery

## 2020-08-22 ENCOUNTER — Encounter: Payer: Self-pay | Admitting: Nurse Practitioner

## 2020-08-28 ENCOUNTER — Telehealth: Payer: Self-pay

## 2020-08-28 NOTE — Telephone Encounter (Signed)
Pt called and would like to speak with Lauren regarding her work form.  Or does she need to make an appt to be seen regarding the form ?

## 2020-08-29 ENCOUNTER — Other Ambulatory Visit: Payer: Self-pay | Admitting: Internal Medicine

## 2020-08-29 NOTE — Telephone Encounter (Signed)
IC discussed with patient in detail. Patient  had dropped off a form a few days ago that Dr Marlou Sa had completed on 08/10 stating it was incorrect and she wanted it changed. Per call today with patient she stated she wanted the 8hrs/day changed said it was too much for her to be standing and working 8 hours a day. 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  She stated she is currently trying to be forced by her boss at the [employer] to do machine work. She repeated multiple times "I am not trying to do all of that and get hurt at work again" I expressed to her not likely that Dr Marlou Sa would change form as it was reflective of her most recent OV with him based off of his exam and xrays done on patient. I advised her I would discuss with Dr Marlou Sa when he returned into clinic tomorrow.

## 2020-08-30 NOTE — Telephone Encounter (Signed)
IC advised per Dr Marlou Sa below.

## 2020-08-30 NOTE — Telephone Encounter (Signed)
Cant change form   we did it together

## 2020-08-31 DIAGNOSIS — Z20822 Contact with and (suspected) exposure to covid-19: Secondary | ICD-10-CM | POA: Diagnosis not present

## 2020-08-31 DIAGNOSIS — J029 Acute pharyngitis, unspecified: Secondary | ICD-10-CM | POA: Diagnosis not present

## 2020-10-11 ENCOUNTER — Telehealth: Payer: Self-pay | Admitting: Orthopedic Surgery

## 2020-10-11 NOTE — Telephone Encounter (Signed)
Pt called requesting a release back to work letter. Please call pt when ready for pick up. Pt phone number is [phone].

## 2020-10-11 NOTE — Telephone Encounter (Signed)
Ok for that

## 2020-10-12 NOTE — Telephone Encounter (Signed)
tyvm

## 2020-10-12 NOTE — Telephone Encounter (Signed)
Note completed and ready for pt to pick up at front desk.

## 2020-11-06 ENCOUNTER — Other Ambulatory Visit: Payer: Self-pay | Admitting: Internal Medicine

## 2020-11-08 DIAGNOSIS — Z1231 Encounter for screening mammogram for malignant neoplasm of breast: Secondary | ICD-10-CM | POA: Diagnosis not present

## 2020-11-14 ENCOUNTER — Other Ambulatory Visit: Payer: Self-pay | Admitting: Internal Medicine

## 2020-11-29 ENCOUNTER — Other Ambulatory Visit: Payer: Self-pay | Admitting: Internal Medicine

## 2020-12-05 DIAGNOSIS — Z01419 Encounter for gynecological examination (general) (routine) without abnormal findings: Secondary | ICD-10-CM | POA: Diagnosis not present

## 2021-01-12 DIAGNOSIS — R519 Headache, unspecified: Secondary | ICD-10-CM | POA: Diagnosis not present

## 2021-01-16 DIAGNOSIS — K080 Exfoliation of teeth due to systemic causes: Secondary | ICD-10-CM | POA: Diagnosis not present

## 2021-01-24 ENCOUNTER — Encounter: Payer: Self-pay | Admitting: Orthopedic Surgery

## 2021-01-24 ENCOUNTER — Ambulatory Visit (INDEPENDENT_AMBULATORY_CARE_PROVIDER_SITE_OTHER): Payer: Federal, State, Local not specified - PPO | Admitting: Orthopedic Surgery

## 2021-01-24 ENCOUNTER — Other Ambulatory Visit: Payer: Self-pay

## 2021-01-24 ENCOUNTER — Ambulatory Visit: Payer: Self-pay

## 2021-01-24 DIAGNOSIS — M25512 Pain in left shoulder: Secondary | ICD-10-CM | POA: Diagnosis not present

## 2021-01-24 MED ORDER — DICLOFENAC EPOLAMINE 1.3 % EX PTCH: 1.0000 | MEDICATED_PATCH | Freq: Two times a day (BID) | CUTANEOUS | 0 refills | Status: DC

## 2021-01-24 MED ORDER — PREDNISONE 5 MG (21) PO TBPK: ORAL_TABLET | ORAL | 0 refills | Status: DC

## 2021-01-24 NOTE — Progress Notes (Signed)
Office Visit Note   Patient: Krista Miles           Date of Birth: 30-Apr-1955           MRN: [id number] Visit Date: 01/24/2021 Requested by: Glendale Chard, [address] [place],  [place] [address] PCP: Glendale Chard, MD  Subjective: Chief Complaint  Patient presents with   Left Shoulder - Pain    HPI: Krista Miles is a 66 year old female with left shoulder pain.  Been going on for 1 month.  She has to do a lot of lifting of boxes at work.  Localizes pain to the lateral part of the shoulder.  Denies any neck pain or radicular symptoms.  She tried some over-the-counter medication without much relief.  Does not report any significant weakness but does report pain which does occasionally interfere with her being able to lay on it at night.              ROS: All systems reviewed are negative as they relate to the chief complaint within the history of present illness.  Patient denies  fevers or chills.   Assessment & Plan: Visit Diagnoses:  1. Left shoulder pain, unspecified chronicity     Plan: Impression is left shoulder pain.  Motion is well-maintained.  Strength is good.  She has a little bit of clicking with internal and external rotation at 90 degrees of AB duction present on the left but not present on the right.  I think she may have partial-thickness rotator cuff tear versus bursitis versus other more significant rotator cuff problem.  Nonetheless I think with 4 weeks of symptoms we will try Medrol Dosepak and the Flector patch.  Follow-up in 4 to 6 weeks if no better and we could consider injection and/or MRI scanning at that time  Follow-Up Instructions: No follow-ups on file.   Orders:  Orders Placed This Encounter  Procedures   XR Shoulder Left   No orders of the defined types were placed in this encounter.     Procedures: No procedures performed   Clinical Data: No additional findings.  Objective: Vital Signs: There were no vitals taken for  this visit.  Physical Exam:   Constitutional: Patient appears well-developed HEENT:  Head: Normocephalic Eyes:EOM are normal Neck: Normal range of motion Cardiovascular: Normal rate Pulmonary/chest: Effort normal Neurologic: Patient is alert Skin: Skin is warm Psychiatric: Patient has normal mood and affect   Ortho Exam: Ortho exam demonstrates full active and passive range of motion of the shoulder.  Passive range of motion is 70/100/175.  Rotator cuff strength is good infraspinatus supraspinatus and subscap muscle testing on the left with no discrete AC joint tenderness present.  O'Brien's testing equivocal on the left negative on the right.  She has no apprehension or relocation testing.  Motor or sensory function to the hand is intact.  Deltoid is functional.  Specialty Comments:  No specialty comments available.  Imaging: XR Shoulder Left  Result Date: 01/24/2021 AP axillary outlet radiographs left shoulder reviewed.  Shoulder is located.  No acute fracture.  Acromiohumeral distance maintained.  No glenohumeral joint or AC joint arthritis is present.  Visualized lung fields clear.  Normal radiographs left shoulder    PMFS History: Patient Active Problem List   Diagnosis Date Noted   Acute left-sided low back pain without sciatica 12/07/2017   Urinary frequency 12/07/2017   Hyperlipidemia 10/28/2017   Abnormal glucose 10/28/2017   Past Medical History:  Diagnosis Date  Back pain    Hyperlipidemia    Prediabetes    Vitamin D deficiency     Family History  Problem Relation Age of Onset   Dementia Mother    Cancer Father    Leukemia Father    Cancer Maternal Aunt     Past Surgical History:  Procedure Laterality Date   CESAREAN SECTION     HAND SURGERY Bilateral 2016   TRIGGER FINGER RELEASE Left 10/04/2017   Dr. Alphonzo Severance   TRIGGER FINGER RELEASE Right 05/06/2018   right ring finger   Social History   Occupational History   Occupation: [employer]  Tobacco  Use   Smoking status: Never   Smokeless tobacco: Never  Vaping Use   Vaping Use: Never used  Substance and Sexual Activity   Alcohol use: No   Drug use: No   Sexual activity: Not on file

## 2021-01-24 NOTE — Addendum Note (Signed)
Addended byLaurann Montana on: 01/24/2021 11:06 AM   Modules accepted: Orders

## 2021-02-07 ENCOUNTER — Ambulatory Visit: Payer: Federal, State, Local not specified - PPO | Admitting: Internal Medicine

## 2021-02-07 ENCOUNTER — Other Ambulatory Visit: Payer: Self-pay

## 2021-02-07 ENCOUNTER — Encounter: Payer: Self-pay | Admitting: Internal Medicine

## 2021-02-07 VITALS — BP 110/66 | HR 80 | Temp 98.5°F | Ht 61.0 in | Wt 170.4 lb

## 2021-02-07 DIAGNOSIS — E6609 Other obesity due to excess calories: Secondary | ICD-10-CM | POA: Diagnosis not present

## 2021-02-07 DIAGNOSIS — G8929 Other chronic pain: Secondary | ICD-10-CM

## 2021-02-07 DIAGNOSIS — Z2821 Immunization not carried out because of patient refusal: Secondary | ICD-10-CM

## 2021-02-07 DIAGNOSIS — E66811 Obesity, class 1: Secondary | ICD-10-CM

## 2021-02-07 DIAGNOSIS — Z6832 Body mass index (BMI) 32.0-32.9, adult: Secondary | ICD-10-CM

## 2021-02-07 DIAGNOSIS — M545 Low back pain, unspecified: Secondary | ICD-10-CM | POA: Diagnosis not present

## 2021-02-07 MED ORDER — WEGOVY 0.5 MG/0.5ML ~~LOC~~ SOAJ: 0.5000 mg | SUBCUTANEOUS | 1 refills | Status: DC

## 2021-02-07 NOTE — Patient Instructions (Signed)

## 2021-02-07 NOTE — Progress Notes (Signed)
Rich Brave Llittleton,acting as a Education administrator for Maximino Greenland, MD.,have documented all relevant documentation on the behalf of Maximino Greenland, MD,as directed by  Maximino Greenland, MD while in the presence of Maximino Greenland, MD.  This visit occurred during the SARS-CoV-2 public health emergency.  Safety protocols were in place, including screening questions prior to the visit, additional usage of staff PPE, and extensive cleaning of exam room while observing appropriate contact time as indicated for disinfecting solutions.  Subjective:     Patient ID: Krista Miles , female    DOB: 03-27-1955 , 66 y.o.   MRN: [id number]   Chief Complaint  Patient presents with   Weight Check    HPI  Pt presents today for weight check. She reports she has not had her Wegovy in at least 7 months. She would like to restart the medication. She did not have any issues while on the medication.     Past Medical History:  Diagnosis Date   Back pain    Hyperlipidemia    Prediabetes    Vitamin D deficiency      Family History  Problem Relation Age of Onset   Dementia Mother    Cancer Father    Leukemia Father    Cancer Maternal Aunt      Current Outpatient Medications:    gabapentin (NEURONTIN) 300 MG capsule, TAKE 1 CAPSULE BY MOUTH TWICE A DAY, Disp: 180 capsule, Rfl: 1   Vitamin D, Ergocalciferol, (DRISDOL) 1.25 MG (50000 UNIT) CAPS capsule, TAKE 1 CAPSULE EVERY WEEK ON TUESDAYS AND THURSDAYS, Disp: 24 capsule, Rfl: 0   Acetaminophen-Codeine 300-30 MG tablet, Take 1 tablet by mouth every 6 (six) hours as needed., Disp: , Rfl:    clindamycin (CLEOCIN) 150 MG capsule, Take 150 mg by mouth 3 (three) times daily., Disp: , Rfl:    Semaglutide-Weight Management (WEGOVY) 0.5 MG/0.5ML SOAJ, Inject 0.5 mg into the skin once a week., Disp: 2 mL, Rfl: 1   No Known Allergies   Review of Systems  Constitutional: Negative.   Respiratory: Negative.    Cardiovascular: Negative.   Gastrointestinal:  Negative.   Neurological: Negative.   Psychiatric/Behavioral: Negative.      Today's Vitals   02/07/21 1505  BP: 110/66  Pulse: 80  Temp: 98.5 F (36.9 C)  Weight: 170 lb 6.4 oz (77.3 kg)  Height: 5\' 1"  (1.549 m)  PainSc: 0-No pain   Body mass index is 32.2 kg/m.  Wt Readings from Last 3 Encounters:  02/07/21 170 lb 6.4 oz (77.3 kg)  06/07/20 166 lb (75.3 kg)  05/08/20 169 lb (76.7 kg)     Objective:  Physical Exam Vitals and nursing note reviewed.  Constitutional:      Appearance: Normal appearance.  HENT:     Head: Normocephalic and atraumatic.     Nose:     Comments: Masked     Mouth/Throat:     Comments: Masked  Eyes:     Extraocular Movements: Extraocular movements intact.  Cardiovascular:     Rate and Rhythm: Normal rate and regular rhythm.     Heart sounds: Normal heart sounds.  Pulmonary:     Effort: Pulmonary effort is normal.     Breath sounds: Normal breath sounds.  Musculoskeletal:     Cervical back: Normal range of motion.  Skin:    General: Skin is warm.  Neurological:     General: No focal deficit present.     Mental Status: She is  alert.  Psychiatric:        Mood and Affect: Mood normal.        Behavior: Behavior normal.        Assessment And Plan:     1. Class 1 obesity due to excess calories without serious comorbidity with body mass index (BMI) of 32.0 to 32.9 in adult  We discussed the use of Wegovy for obesity. She wishes to resume this medication. She denies family/personal h/o thyroid cancer. She was given 0.25mg  samples and advised how to administer the medication. She understands that she will not take her next dose until next week. She is reminded to stop eating when full. She will rto in 6 weeks for re-evaluation.  Possible side effects d/w patient.   2. Chronic bilateral low back pain without sciatica Comments: Chronic, gabapentin was refilled.   3. Influenza vaccination declined  4. Herpes zoster vaccination declined   We  discussed the use of Wegovy for obesity. She denies family/personal h/o thyroid cancer. She was given 0.25mg  samples and advised how to administer the medication. She understands that she will not take her next dose until next week. She is reminded to stop eating when full. She will rto in 4 weeks for re-evaluation.  Possible side effects d/w patient.    Patient was given opportunity to ask questions. Patient verbalized understanding of the plan and was able to repeat key elements of the plan. All questions were answered to their satisfaction.   I, Maximino Greenland, MD, have reviewed all documentation for this visit. The documentation on 02/07/21 for the exam, diagnosis, procedures, and orders are all accurate and complete.   IF YOU HAVE BEEN REFERRED TO A SPECIALIST, IT MAY TAKE 1-2 WEEKS TO SCHEDULE/PROCESS THE REFERRAL. IF YOU HAVE NOT HEARD FROM US/SPECIALIST IN TWO WEEKS, PLEASE GIVE [place] A CALL AT [phone].   THE PATIENT IS ENCOURAGED TO PRACTICE SOCIAL DISTANCING DUE TO THE COVID-19 PANDEMIC.

## 2021-02-08 ENCOUNTER — Telehealth: Payer: Self-pay

## 2021-02-08 NOTE — Telephone Encounter (Signed)
Patient and pharmacy notified that her prior auth for M[employer]ons has been approved through July 2023. YL,RMA

## 2021-02-13 ENCOUNTER — Other Ambulatory Visit: Payer: Self-pay | Admitting: Internal Medicine

## 2021-03-13 ENCOUNTER — Ambulatory Visit: Payer: Federal, State, Local not specified - PPO | Admitting: Internal Medicine

## 2021-03-19 ENCOUNTER — Encounter: Payer: Self-pay | Admitting: Internal Medicine

## 2021-03-19 ENCOUNTER — Ambulatory Visit: Payer: Federal, State, Local not specified - PPO | Admitting: Internal Medicine

## 2021-03-19 ENCOUNTER — Other Ambulatory Visit: Payer: Self-pay

## 2021-03-19 VITALS — BP 116/72 | HR 91 | Temp 98.1°F | Ht 61.0 in | Wt 164.8 lb

## 2021-03-19 DIAGNOSIS — E559 Vitamin D deficiency, unspecified: Secondary | ICD-10-CM | POA: Diagnosis not present

## 2021-03-19 DIAGNOSIS — Z6831 Body mass index (BMI) 31.0-31.9, adult: Secondary | ICD-10-CM

## 2021-03-19 DIAGNOSIS — M79642 Pain in left hand: Secondary | ICD-10-CM

## 2021-03-19 DIAGNOSIS — E6609 Other obesity due to excess calories: Secondary | ICD-10-CM | POA: Diagnosis not present

## 2021-03-19 DIAGNOSIS — M79641 Pain in right hand: Secondary | ICD-10-CM | POA: Diagnosis not present

## 2021-03-19 DIAGNOSIS — Z2821 Immunization not carried out because of patient refusal: Secondary | ICD-10-CM

## 2021-03-19 MED ORDER — WEGOVY 0.5 MG/0.5ML ~~LOC~~ SOAJ: 0.5000 mg | SUBCUTANEOUS | 1 refills | Status: DC

## 2021-03-19 MED ORDER — GABAPENTIN 300 MG PO CAPS: 300.0000 mg | ORAL_CAPSULE | Freq: Two times a day (BID) | ORAL | 1 refills | Status: DC

## 2021-03-19 NOTE — Patient Instructions (Addendum)
Magnesium glycinate, one capsule nightly.  ?Ginger/turmeric tea to decrease inflammation ? ?Exercising to Lose Weight ?Getting regular exercise is important for everyone. It is especially important if you are overweight. Being overweight increases your risk of heart disease, stroke, diabetes, high blood pressure, and several types of cancer. Exercising, and reducing the calories you consume, can help you lose weight and improve fitness and health. ?Exercise can be moderate or vigorous intensity. To lose weight, most people need to do a certain amount of moderate or vigorous-intensity exercise each week. ?How can exercise affect me? ?You lose weight when you exercise enough to burn more calories than you eat. Exercise also reduces body fat and builds muscle. The more muscle you have, the more calories you burn. Exercise also: ?Improves mood. ?Reduces stress and tension. ?Improves your overall fitness, flexibility, and endurance. ?Increases bone strength. ?Moderate-intensity exercise ?Moderate-intensity exercise is any activity that gets you moving enough to burn at least three times more energy (calories) than if you were sitting. ?Examples of moderate exercise include: ?Walking a mile in 15 minutes. ?Doing light yard work. ?Biking at an easy pace. ?Most people should get at least 150 minutes of moderate-intensity exercise a week to maintain their body weight. ?Vigorous-intensity exercise ?Vigorous-intensity exercise is any activity that gets you moving enough to burn at least six times more calories than if you were sitting. When you exercise at this intensity, you should be working hard enough that you are not able to carry on a conversation. ?Examples of vigorous exercise include: ?Running. ?Playing a team sport, such as football, basketball, and soccer. ?Jumping rope. ?Most people should get at least 75 minutes a week of vigorous exercise to maintain their body weight. ?What actions can I take to lose  weight? ?The amount of exercise you need to lose weight depends on: ?Your age. ?The type of exercise. ?Any health conditions you have. ?Your overall physical ability. ?Talk to your health care provider about how much exercise you need and what types of activities are safe for you. ?Nutrition ? ?Make changes to your diet as told by your health care provider or diet and nutrition specialist (dietitian). This may include: ?Eating fewer calories. ?Eating more protein. ?Eating less unhealthy fats. ?Eating a diet that includes fresh fruits and vegetables, whole grains, low-fat dairy products, and lean protein. ?Avoiding foods with added fat, salt, and sugar. ?Drink plenty of water while you exercise to prevent dehydration or heat stroke. ?Activity ?Choose an activity that you enjoy and set realistic goals. Your health care provider can help you make an exercise plan that works for you. ?Exercise at a moderate or vigorous intensity most days of the week. ?The intensity of exercise may vary from person to person. You can tell how intense a workout is for you by paying attention to your breathing and heartbeat. Most people will notice their breathing and heartbeat get faster with more intense exercise. ?Do resistance training twice each week, such as: ?Push-ups. ?Sit-ups. ?Lifting weights. ?Using resistance bands. ?Getting short amounts of exercise can be just as helpful as long, structured periods of exercise. If you have trouble finding time to exercise, try doing these things as part of your daily routine: ?Get up, stretch, and walk around every 30 minutes throughout the day. ?Go for a walk during your lunch break. ?Park your car farther away from your destination. ?If you take public transportation, get off one stop early and walk the rest of the way. ?Make phone calls while standing up  and walking around. ?Take the stairs instead of elevators or escalators. ?Wear comfortable clothes and shoes with good support. ?Do not  exercise so much that you hurt yourself, feel dizzy, or get very short of breath. ?Where to find more information ?[place] Department of Health and Human Services: BondedCompany.at ?Centers for Disease Control and Prevention: http://www.wolf.info/ ?Contact a health care provider: ?Before starting a new exercise program. ?If you have questions or concerns about your weight. ?If you have a medical problem that keeps you from exercising. ?Get help right away if: ?You have any of the following while exercising: ?Injury. ?Dizziness. ?Difficulty breathing or shortness of breath that does not go away when you stop exercising. ?Chest pain. ?Rapid heartbeat. ?These symptoms may represent a serious problem that is an emergency. Do not wait to see if the symptoms will go away. Get medical help right away. Call your local emergency services (911 in the [place] Do not drive yourself to the hospital. ?Summary ?Getting regular exercise is especially important if you are overweight. ?Being overweight increases your risk of heart disease, stroke, diabetes, high blood pressure, and several types of cancer. ?Losing weight happens when you burn more calories than you eat. ?Reducing the amount of calories you eat, and getting regular moderate or vigorous exercise each week, helps you lose weight. ?This information is not intended to replace advice given to you by your health care provider. Make sure you discuss any questions you have with your health care provider. ?Document Revised: 02/20/2020 Document Reviewed: 02/20/2020 ?Elsevier Patient Education ? [place]. ? ?

## 2021-03-19 NOTE — Progress Notes (Signed)
?Rich Brave Llittleton,acting as a Education administrator for Maximino Greenland, MD.,have documented all relevant documentation on the behalf of Maximino Greenland, MD,as directed by  Maximino Greenland, MD while in the presence of Maximino Greenland, MD.  ?This visit occurred during the SARS-CoV-2 public health emergency.  Safety protocols were in place, including screening questions prior to the visit, additional usage of staff PPE, and extensive cleaning of exam room while observing appropriate contact time as indicated for disinfecting solutions. ? ?Subjective:  ?  ? Patient ID: Krista Miles , female    DOB: 1955/12/30 , 66 y.o.   MRN: [id number] ? ? ?Chief Complaint  ?Patient presents with  ? Weight Check  ? ? ?HPI ? ?Pt presents today for weight check. Patient is currently taking Wegovy 0.'5mg'$  and is tolerating it well. She would like to continue with this dose.  ?  ? ?Past Medical History:  ?Diagnosis Date  ? Back pain   ? Hyperlipidemia   ? Prediabetes   ? Vitamin D deficiency   ?  ? ?Family History  ?Problem Relation Age of Onset  ? Dementia Mother   ? Cancer Father   ? Leukemia Father   ? Cancer Maternal Aunt   ? ? ? ?Current Outpatient Medications:  ?  Acetaminophen-Codeine 300-30 MG tablet, Take 1 tablet by mouth every 6 (six) hours as needed., Disp: , Rfl:  ?  Vitamin D, Ergocalciferol, (DRISDOL) 1.25 MG (50000 UNIT) CAPS capsule, TAKE 1 CAPSULE EVERY WEEK ON TUESDAYS AND THURSDAYS, Disp: 24 capsule, Rfl: 0 ?  gabapentin (NEURONTIN) 300 MG capsule, Take 1 capsule (300 mg total) by mouth 2 (two) times daily., Disp: 180 capsule, Rfl: 1 ?  Semaglutide-Weight Management (WEGOVY) 0.5 MG/0.5ML SOAJ, Inject 0.5 mg into the skin once a week., Disp: 2 mL, Rfl: 1  ? ?No Known Allergies  ? ?Review of Systems  ?Constitutional: Negative.   ?Respiratory: Negative.    ?Cardiovascular: Negative.   ?Gastrointestinal: Negative.   ?Musculoskeletal:  Positive for arthralgias.  ?     She c/o b/l hand pain. She states her sx have worsened over the  past several months. She is now back working at the [employer].   ?Neurological: Negative.   ?Psychiatric/Behavioral: Negative.     ? ?Today's Vitals  ? 03/19/21 1015  ?BP: 116/72  ?Pulse: 91  ?Temp: 98.1 ?F (36.7 ?C)  ?Weight: 164 lb 12.8 oz (74.8 kg)  ?Height: '5\' 1"'$  (1.549 m)  ?PainSc: 0-No pain  ? ?Body mass index is 31.14 kg/m?.  ?Wt Readings from Last 3 Encounters:  ?03/19/21 164 lb 12.8 oz (74.8 kg)  ?02/07/21 170 lb 6.4 oz (77.3 kg)  ?06/07/20 166 lb (75.3 kg)  ?  ? ?Objective:  ?Physical Exam ?Vitals and nursing note reviewed.  ?Constitutional:   ?   Appearance: Normal appearance.  ?HENT:  ?   Head: Normocephalic and atraumatic.  ?   Nose:  ?   Comments: Masked  ?   Mouth/Throat:  ?   Comments: Masked  ?Eyes:  ?   Extraocular Movements: Extraocular movements intact.  ?Cardiovascular:  ?   Rate and Rhythm: Normal rate and regular rhythm.  ?   Heart sounds: Normal heart sounds.  ?Pulmonary:  ?   Effort: Pulmonary effort is normal.  ?   Breath sounds: Normal breath sounds.  ?Musculoskeletal:  ?   Cervical back: Normal range of motion.  ?Skin: ?   General: Skin is warm.  ?Neurological:  ?  General: No focal deficit present.  ?   Mental Status: She is alert.  ?Psychiatric:     ?   Mood and Affect: Mood normal.     ?   Behavior: Behavior normal.  ?    ?Assessment And Plan:  ?   ?1. Class 1 obesity due to excess calories without serious comorbidity with body mass index (BMI) of 31.0 to 31.9 in adult ?Comments: She was congratulated on her 6 lb weight loss. She will c/w [facility] 0.'5mg'$  weekly. She will rto in June 2023 for her next physical exam.  ? ?2. Pain in both hands ?Comments: Negative squeeze test b/l. I will check an arthritis panel. ADvised to consider ginger/turmeric tea to help decrease inflammation.  ?- ANA, IFA (with reflex) ?- CYCLIC CITRUL PEPTIDE ANTIBODY, IGG/IGA ?- Rheumatoid factor ?- Sedimentation rate ?- Uric acid ? ?3. Vitamin D deficiency disease ?Comments: I will check a vitamin D level and  supplement as needed.  ?- Vitamin D (25 hydroxy) ? ?4. Tetanus, diphtheria, and acellular pertussis (Tdap) vaccination declined ?  ?Patient was given opportunity to ask questions. Patient verbalized understanding of the plan and was able to repeat key elements of the plan. All questions were answered to their satisfaction.  ? ?I, Maximino Greenland, MD, have reviewed all documentation for this visit. The documentation on 03/19/21 for the exam, diagnosis, procedures, and orders are all accurate and complete.  ? ?IF YOU HAVE BEEN REFERRED TO A SPECIALIST, IT MAY TAKE 1-2 WEEKS TO SCHEDULE/PROCESS THE REFERRAL. IF YOU HAVE NOT HEARD FROM US/SPECIALIST IN TWO WEEKS, PLEASE GIVE [place] A CALL AT [phone] X [phone].  ? ?THE PATIENT IS ENCOURAGED TO PRACTICE SOCIAL DISTANCING DUE TO THE COVID-19 PANDEMIC.   ?

## 2021-03-23 LAB — VITAMIN D 25 HYDROXY (VIT D DEFICIENCY, FRACTURES): Vit D, 25-Hydroxy: 138.4 ng/mL — ABNORMAL HIGH (ref 30.0–100.0)

## 2021-03-23 LAB — URIC ACID: Uric Acid: 4 mg/dL (ref 3.0–7.2)

## 2021-03-23 LAB — CYCLIC CITRUL PEPTIDE ANTIBODY, IGG/IGA: Cyclic Citrullin Peptide Ab: 6 units (ref 0–19)

## 2021-03-23 LAB — RHEUMATOID FACTOR: Rheumatoid fact SerPl-aCnc: 12.7 IU/mL (ref <14.0)

## 2021-03-23 LAB — ANTINUCLEAR ANTIBODIES, IFA: ANA Titer 1: NEGATIVE

## 2021-03-23 LAB — SEDIMENTATION RATE: Sed Rate: 5 mm/hr (ref 0–40)

## 2021-03-26 ENCOUNTER — Other Ambulatory Visit: Payer: Self-pay | Admitting: Orthopedic Surgery

## 2021-03-29 DIAGNOSIS — M5431 Sciatica, right side: Secondary | ICD-10-CM | POA: Diagnosis not present

## 2021-03-29 DIAGNOSIS — M545 Low back pain, unspecified: Secondary | ICD-10-CM | POA: Diagnosis not present

## 2021-03-29 DIAGNOSIS — M25512 Pain in left shoulder: Secondary | ICD-10-CM | POA: Diagnosis not present

## 2021-03-29 DIAGNOSIS — M25562 Pain in left knee: Secondary | ICD-10-CM | POA: Diagnosis not present

## 2021-04-09 ENCOUNTER — Ambulatory Visit: Payer: Federal, State, Local not specified - PPO | Admitting: Orthopedic Surgery

## 2021-05-02 ENCOUNTER — Other Ambulatory Visit: Payer: Self-pay | Admitting: Internal Medicine

## 2021-05-07 ENCOUNTER — Ambulatory Visit (INDEPENDENT_AMBULATORY_CARE_PROVIDER_SITE_OTHER): Payer: Federal, State, Local not specified - PPO

## 2021-05-07 ENCOUNTER — Encounter: Payer: Self-pay | Admitting: Orthopedic Surgery

## 2021-05-07 ENCOUNTER — Ambulatory Visit: Payer: Federal, State, Local not specified - PPO | Admitting: Orthopedic Surgery

## 2021-05-07 DIAGNOSIS — M25562 Pain in left knee: Secondary | ICD-10-CM

## 2021-05-07 NOTE — Progress Notes (Signed)
? ?Office Visit Note ?  ?Patient: Krista Miles           ?Date of Birth: [birth year]           ?MRN: [id number] ?Visit Date: 05/07/2021 ?Requested by: Glendale Chard, MD ?[address] ?[address] ?[place],  [place] [address] ?PCP: Glendale Chard, MD ? ?Subjective: ?Chief Complaint  ?Patient presents with  ? Left Knee - Pain  ? ? ?HPI: Patient presents for evaluation of left knee pain.  Has had pain for several weeks but denies any history of injury.  Pain does occasionally wake her from sleep at night.  Does have some pain with weightbearing.  Localizes pain in the posterior medial aspect of the knee but denies any mechanical symptoms or giving way. ?             ?ROS: All systems reviewed are negative as they relate to the chief complaint within the history of present illness.  Patient denies  fevers or chills. ? ? ?Assessment & Plan: ?Visit Diagnoses:  ?1. Left knee pain, unspecified chronicity   ? ? ?Plan: Impression is left posterior medial knee pain.  No effusion and good range of motion today and radiographs do not really show any arthritis.  Plan at this time is 6 weeks of physical therapy upstairs for quad strengthening.  Over-the-counter medication as needed such as anti-inflammatories and/or Tylenol.  6-week return with decision for or against MRI scan at that time ? ?Follow-Up Instructions: Return in about 6 weeks (around 06/18/2021).  ? ?Orders:  ?Orders Placed This Encounter  ?Procedures  ? XR KNEE 3 VIEW LEFT  ? Ambulatory referral to Physical Therapy  ? ?No orders of the defined types were placed in this encounter. ? ? ? ? Procedures: ?No procedures performed ? ? ?Clinical Data: ?No additional findings. ? ?Objective: ?Vital Signs: There were no vitals taken for this visit. ? ?Physical Exam:  ? ?Constitutional: Patient appears well-developed ?HEENT:  ?Head: Normocephalic ?Eyes:EOM are normal ?Neck: Normal range of motion ?Cardiovascular: Normal rate ?Pulmonary/chest: Effort normal ?Neurologic: Patient  is alert ?Skin: Skin is warm ?Psychiatric: Patient has normal mood and affect ? ? ?Ortho Exam: Ortho exam demonstrates full active and passive range of motion of the left knee.  No groin pain with internal/external Tatian of the leg.  No real focal joint line tenderness on the medial or lateral side with extensor mechanism intact.  Collateral and cruciate ligaments are stable.  Mild tenderness around the hamstring region posterior medially. ? ?Specialty Comments:  ?No specialty comments available. ? ?Imaging: ?XR KNEE 3 VIEW LEFT ? ?Result Date: 05/07/2021 ?AP lateral merchant radiographs left knee reviewed.  Overall alignment is slight varus.  No significant joint space narrowing.  No acute fracture.  No spurring.  Patella well centered in the trochlear groove.  ? ? ?PMFS History: ?Patient Active Problem List  ? Diagnosis Date Noted  ? Acute left-sided low back pain without sciatica 12/07/2017  ? Urinary frequency 12/07/2017  ? Hyperlipidemia 10/28/2017  ? Abnormal glucose 10/28/2017  ? ?Past Medical History:  ?Diagnosis Date  ? Back pain   ? Hyperlipidemia   ? Prediabetes   ? Vitamin D deficiency   ?  ?Family History  ?Problem Relation Age of Onset  ? Dementia Mother   ? Cancer Father   ? Leukemia Father   ? Cancer Maternal Aunt   ?  ?Past Surgical History:  ?Procedure Laterality Date  ? CESAREAN SECTION    ? HAND SURGERY  Bilateral 2016  ? TRIGGER FINGER RELEASE Left 10/04/2017  ? Dr. Alphonzo Severance  ? TRIGGER FINGER RELEASE Right 05/06/2018  ? right ring finger  ? ?Social History  ? ?Occupational History  ? Occupation: [employer]  ?Tobacco Use  ? Smoking status: Never  ? Smokeless tobacco: Never  ?Vaping Use  ? Vaping Use: Never used  ?Substance and Sexual Activity  ? Alcohol use: No  ? Drug use: No  ? Sexual activity: Not on file  ? ? ? ? ? ?

## 2021-05-10 ENCOUNTER — Encounter: Payer: Self-pay | Admitting: Physical Therapy

## 2021-05-10 ENCOUNTER — Ambulatory Visit (INDEPENDENT_AMBULATORY_CARE_PROVIDER_SITE_OTHER): Payer: Federal, State, Local not specified - PPO | Admitting: Physical Therapy

## 2021-05-10 DIAGNOSIS — M25562 Pain in left knee: Secondary | ICD-10-CM

## 2021-05-10 DIAGNOSIS — R2689 Other abnormalities of gait and mobility: Secondary | ICD-10-CM | POA: Diagnosis not present

## 2021-05-10 DIAGNOSIS — M6281 Muscle weakness (generalized): Secondary | ICD-10-CM

## 2021-05-10 DIAGNOSIS — M25662 Stiffness of left knee, not elsewhere classified: Secondary | ICD-10-CM | POA: Diagnosis not present

## 2021-05-10 NOTE — Therapy (Signed)
?OUTPATIENT PHYSICAL THERAPY LOWER EXTREMITY EVALUATION ? ? ?Patient Name: Krista Miles ?MRN: [id number] ?DOB:February 23, 1955, 66 y.o., female ?Today's Date: 05/10/2021 ? ? PT End of Session - 05/10/21 1014   ? ? Visit Number 1   ? Number of Visits 12   ? Date for PT Re-Evaluation 06/21/21   ? Authorization Type [employer] Federal $30 copay   ? PT Start Time 4315   ? PT Stop Time 1008   ? PT Time Calculation (min) 33 min   ? Activity Tolerance Patient tolerated treatment well   ? Behavior During Therapy [facility] for tasks assessed/performed   ? ?  ?  ? ?  ? ? ?Past Medical History:  ?Diagnosis Date  ? Back pain   ? Hyperlipidemia   ? Prediabetes   ? Vitamin D deficiency   ? ?Past Surgical History:  ?Procedure Laterality Date  ? CESAREAN SECTION    ? HAND SURGERY Bilateral 2016  ? TRIGGER FINGER RELEASE Left 10/04/2017  ? Dr. Alphonzo Severance  ? TRIGGER FINGER RELEASE Right 05/06/2018  ? right ring finger  ? ?Patient Active Problem List  ? Diagnosis Date Noted  ? Acute left-sided low back pain without sciatica 12/07/2017  ? Urinary frequency 12/07/2017  ? Hyperlipidemia 10/28/2017  ? Abnormal glucose 10/28/2017  ? ? ?PCP: Maximino Greenland, MD  ? ?REFERRING PROVIDER: Meredith Pel, MD  ? ?REFERRING DIAG: M25.562 (ICD-10-CM) - Left knee pain, unspecified chronicity  ? ?THERAPY DIAG:  ?Acute pain of left knee - Plan: PT plan of care cert/re-cert ? ?Stiffness of left knee, not elsewhere classified - Plan: PT plan of care cert/re-cert ? ?Muscle weakness (generalized) - Plan: PT plan of care cert/re-cert ? ?Other abnormalities of gait and mobility - Plan: PT plan of care cert/re-cert ? ?ONSET DATE: 1 month ago ? ?SUBJECTIVE:  ? ?SUBJECTIVE STATEMENT: ?Pt is a 66 y/o female who presents to OPPT for Lt knee pain.  She reports pain is like a "toothache" and is mainly posterior.  She reports pain for about a month and has progressively gotten worse.  She declined an injection at this time.  No known injury to cause pain. ? ?PERTINENT  HISTORY: ?LBP, HLD ? ?PAIN:  ?Are you having pain? Yes: NPRS scale: 6 currently, up to 10, at best 4/10 ?Pain location: Lt knee, posterior ?Pain description: aching, sharp ?Aggravating factors: going to work, standing/walking ?Relieving factors: medication - tylenol arthritis, rest ? ?PRECAUTIONS: None ? ?WEIGHT BEARING RESTRICTIONS No ? ?FALLS:  ?Has patient fallen in last 6 months? No ? ?LIVING ENVIRONMENT: ?Lives with: lives alone ?Lives in: House/apartment ?Stairs: No ?Has following equipment at home: None ? 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 ?PLOF: Independent and Leisure: spend time with family, go shopping, no regular exercise; does have access to fitness center, outside pool ? ?PATIENT GOALS improve pain, function ? ? ?OBJECTIVE:  ? ?DIAGNOSTIC FINDINGS: x-rays: Overall alignment is slight varus.  No significant joint space narrowing.  No acute fracture.   No spurring.  Patella well centered in the trochlear groove. ? ?PATIENT SURVEYS:  ?05/10/21 FOTO 56 (predicted 51) ? ?COGNITION: ? Overall cognitive status: Within functional limits for tasks assessed   ?  ?SENSATION: ?WFL ? ?PALPATION: ?05/10/21: tenderness at medial tibial plateau on Rt, trigger points noted in adductors with pain at pes anserine bursa as well; no significant posterior knee tenderness today. ? ?LE ROM: ? ?Active ROM Right ?05/10/2021  Left ?05/10/2021  ?Knee flexion 132 109  ?Knee extension 0 0  ? (Blank rows = not tested) ? ?Passive ROM Right ?05/10/2021 Left ?05/10/2021  ?Knee flexion  123  ?with pain  ?Knee extension    ? (Blank rows = not tested) ? ?LE MMT: ? ?MMT Right ?05/10/2021 Left ?05/10/2021  ?Knee flexion 4/5 3/5 ?With pain  ?Knee extension 5/5 4/5 ?With pain  ? (Blank rows = not tested) ? ? ?GAIT: ?Distance walked: 100' ?Assistive device utilized: None ?Level of assistance: Modified independence ?Comments: Antalgic gait, decreased stance on Lt, decreased hip/knee  flexion on Lt ? ? ? ?TODAY'S TREATMENT: ?05/10/21 ?Therex: see HEP; pt performed trial reps PRN with mod cues for technique ? ? ?PATIENT EDUCATION:  ?Education details: HEP ?Person educated: Patient ?Education method: Explanation, Demonstration, and Handouts ?Education comprehension: verbalized understanding, returned demonstration, and needs further education ? ? ?HOME EXERCISE PROGRAM: ?Access Code: [email or web address] ?URL: [email or web address] ?Date: 05/10/2021 ?Prepared by: Faustino Congress ? ?Exercises ?- Seated Knee Extension AROM  - 1-2 x daily - 7 x weekly - 1-2 sets - 10 reps - 3-5 sec hold ?- Seated Straight Leg Raise  - 1-2 x daily - 7 x weekly - 1-2 sets - 10 reps - 2-3 sec hold ?- Supine Heel Slide with Strap  - 1-2 x daily - 7 x weekly - 1-2 sets - 10 reps ?- Standing Knee Flexion  - 1-2 x daily - 7 x weekly - 1-2 sets - 10 reps ? ?ASSESSMENT: ? ?CLINICAL IMPRESSION: ?Patient is a 66 y.o. female who was seen today for physical therapy evaluation and treatment for Lt knee pain. She demonstrates decreased strength and ROM as well as pain and gait abnormalities affecting functional mobility. She will benefit from PT to address deficits listed.  ? ? ?OBJECTIVE IMPAIRMENTS Abnormal gait, decreased mobility, difficulty walking, decreased ROM, decreased strength, increased fascial restrictions, increased muscle spasms, and pain.  ? ?ACTIVITY LIMITATIONS cleaning, community activity, driving, meal prep, occupation, laundry, and shopping.  ? ?PERSONAL FACTORS  None    ? ? ?REHAB POTENTIAL: Good ? ?CLINICAL DECISION MAKING: Stable/uncomplicated ? ?EVALUATION COMPLEXITY: Low ? ? ?GOALS: ?Goals reviewed with patient? Yes ? ?SHORT TERM GOALS: Target date: 05/31/2021 ? ?Independent with initial HEP ?Goal status: INITIAL ? ? ?LONG TERM GOALS: Target date: 06/21/2021 ? ?Independent with final HEP ?Goal status: INITIAL ? ?2.  FOTO score improved to 70 ?Goal status: INITIAL ? ?3.  Lt knee AROM improved to 0-130 for  improved motion and mobility ?Goal status: INIITAL ? ?4.  Report pain < 3/10 with standing/work activities for improved function ?Goal status: INITIAL ? ?5.  Lt knee strength improved to at least 4+/5 without increase in pain for improved function ?Goal status: INITIAL ? ? ? ?PLAN: ?PT FREQUENCY: 1-2x/week ? ?PT DURATION: 6 weeks ? ?PLANNED INTERVENTIONS: Therapeutic exercises, Therapeutic activity, Neuromuscular re-education, Balance training, Gait training, Patient/Family education, Joint mobilization, Aquatic Therapy, Dry Needling, Electrical stimulation, Cryotherapy, Moist heat, Taping, Ultrasound, Ionotophoresis '4mg'$ /ml Dexamethasone, and Manual therapy ? ?PLAN FOR NEXT SESSION: review HEP, work on Geographical information systems officer as able, modalities PRN for pain, may benefit from DN to adductors (did not discuss today), ?ionto to pes anserine bursa ? ? ?Laureen Abrahams, PT, DPT ?05/10/21 10:25 AM ? ? ?

## 2021-05-16 ENCOUNTER — Ambulatory Visit: Payer: Federal, State, Local not specified - PPO | Admitting: Orthopedic Surgery

## 2021-05-21 NOTE — Therapy (Incomplete)
?OUTPATIENT PHYSICAL THERAPY TREATMENT NOTE ? ? ?Patient Name: Krista Miles ?MRN: [id number] ?DOB:1955/04/17, 66 y.o., female ?Today's Date: 05/21/2021 ? ?PCP: Glendale Chard MD ?REFERRING PROVIDER: Meredith Pel MD ? ?END OF SESSION:  ? ? ?Past Medical History:  ?Diagnosis Date  ? Back pain   ? Hyperlipidemia   ? Prediabetes   ? Vitamin D deficiency   ? ?Past Surgical History:  ?Procedure Laterality Date  ? CESAREAN SECTION    ? HAND SURGERY Bilateral 2016  ? TRIGGER FINGER RELEASE Left 10/04/2017  ? Dr. Alphonzo Severance  ? TRIGGER FINGER RELEASE Right 05/06/2018  ? right ring finger  ? ?Patient Active Problem List  ? Diagnosis Date Noted  ? Acute left-sided low back pain without sciatica 12/07/2017  ? Urinary frequency 12/07/2017  ? Hyperlipidemia 10/28/2017  ? Abnormal glucose 10/28/2017  ? ? ?REFERRING DIAG: M25.562 (ICD-10-CM) - Left knee pain, unspecified chronicity  ? ?THERAPY DIAG:  ?No diagnosis found. ? ?PERTINENT HISTORY: LBP, HLD ? ?PRECAUTIONS: none ? ?SUBJECTIVE: *** ? ?PAIN:  ?Are you having pain? Yes: NPRS scale: ***/10 ?Pain location: *** ?Pain description: *** ?Aggravating factors: *** ?Relieving factors: *** ? ? ?OBJECTIVE: (objective measures completed at initial evaluation unless otherwise dated) ? ?DIAGNOSTIC FINDINGS: x-rays: Overall alignment is slight varus.  No significant joint space narrowing.  No acute fracture.   No spurring.  Patella well centered in the trochlear groove. ?  ?PATIENT SURVEYS:  ?05/10/21 FOTO 71 (predicted 32) ?  ?COGNITION: ?          Overall cognitive status: Within functional limits for tasks assessed               ?           ?SENSATION: ?WFL ?  ?PALPATION: ?05/10/21: tenderness at medial tibial plateau on Rt, trigger points noted in adductors with pain at pes anserine bursa as well; no significant posterior knee tenderness today. ?  ?LE ROM: ?  ?Active ROM Right ?05/10/2021 Left ?05/10/2021  ?Knee flexion 132 109  ?Knee extension 0 0  ? (Blank rows = not tested) ?   ?Passive ROM Right ?05/10/2021 Left ?05/10/2021  ?Knee flexion   123  ?with pain  ?Knee extension      ? (Blank rows = not tested) ?  ?LE MMT: ?  ?MMT Right ?05/10/2021 Left ?05/10/2021  ?Knee flexion 4/5 3/5 ?With pain  ?Knee extension 5/5 4/5 ?With pain  ? (Blank rows = not tested) ?  ?  ?GAIT: ?Distance walked: 100' ?Assistive device utilized: None ?Level of assistance: Modified independence ?Comments: Antalgic gait, decreased stance on Lt, decreased hip/knee flexion on Lt ?  ?  ?  ?TODAY'S TREATMENT: ?05/10/21 ?Therex: see HEP; pt performed trial reps PRN with mod cues for technique ?  ?  ?PATIENT EDUCATION:  ?Education details: HEP ?Person educated: Patient ?Education method: Explanation, Demonstration, and Handouts ?Education comprehension: verbalized understanding, returned demonstration, and needs further education ?  ?  ?HOME EXERCISE PROGRAM: ?Access Code: [email or web address] ?URL: [email or web address] ?Date: 05/10/2021 ?Prepared by: Faustino Congress ?  ?Exercises ?- Seated Knee Extension AROM  - 1-2 x daily - 7 x weekly - 1-2 sets - 10 reps - 3-5 sec hold ?- Seated Straight Leg Raise  - 1-2 x daily - 7 x weekly - 1-2 sets - 10 reps - 2-3 sec hold ?- Supine Heel Slide with Strap  - 1-2 x daily - 7 x weekly - 1-2 sets - 10 reps ?- Standing Knee Flexion  -  1-2 x daily - 7 x weekly - 1-2 sets - 10 reps ?  ?ASSESSMENT: ?  ?CLINICAL IMPRESSION: ?Patient is a 66 y.o. female who was seen today for physical therapy evaluation and treatment for Lt knee pain. She demonstrates decreased strength and ROM as well as pain and gait abnormalities affecting functional mobility. She will benefit from PT to address deficits listed.  ?  ?  ?OBJECTIVE IMPAIRMENTS Abnormal gait, decreased mobility, difficulty walking, decreased ROM, decreased strength, increased fascial restrictions, increased muscle spasms, and pain.  ?  ?ACTIVITY LIMITATIONS cleaning, community activity, driving, meal prep, occupation, laundry, and shopping.  ?   ?PERSONAL FACTORS  None    ?  ?  ?REHAB POTENTIAL: Good ?  ?CLINICAL DECISION MAKING: Stable/uncomplicated ?  ?EVALUATION COMPLEXITY: Low ?  ?  ?GOALS: ?Goals reviewed with patient? Yes ?  ?SHORT TERM GOALS: Target date: 05/31/2021 ?  ?Independent with initial HEP ?Goal status: INITIAL ?  ?  ?LONG TERM GOALS: Target date: 06/21/2021 ?  ?Independent with final HEP ?Goal status: INITIAL ?  ?2.  FOTO score improved to 70 ?Goal status: INITIAL ?  ?3.  Lt knee AROM improved to 0-130 for improved motion and mobility ?Goal status: INIITAL ?  ?4.  Report pain < 3/10 with standing/work activities for improved function ?Goal status: INITIAL ?  ?5.  Lt knee strength improved to at least 4+/5 without increase in pain for improved function ?Goal status: INITIAL ?  ?  ?  ?PLAN: ?PT FREQUENCY: 1-2x/week ?  ?PT DURATION: 6 weeks ?  ?PLANNED INTERVENTIONS: Therapeutic exercises, Therapeutic activity, Neuromuscular re-education, Balance training, Gait training, Patient/Family education, Joint mobilization, Aquatic Therapy, Dry Needling, Electrical stimulation, Cryotherapy, Moist heat, Taping, Ultrasound, Ionotophoresis '4mg'$ /ml Dexamethasone, and Manual therapy ?  ?PLAN FOR NEXT SESSION: review HEP, work on Geographical information systems officer as able, modalities PRN for pain, may benefit from DN to adductors (did not discuss today), ?ionto to pes anserine bursa ? ? ? ? ?Oretha Caprice, PT MPT ?05/21/2021, 3:54 PM ? ?  ? ?

## 2021-05-22 ENCOUNTER — Encounter: Payer: Self-pay | Admitting: Physical Therapy

## 2021-05-22 ENCOUNTER — Ambulatory Visit: Payer: Federal, State, Local not specified - PPO | Admitting: Physical Therapy

## 2021-05-22 ENCOUNTER — Encounter: Payer: Federal, State, Local not specified - PPO | Admitting: Physical Therapy

## 2021-05-22 DIAGNOSIS — R2689 Other abnormalities of gait and mobility: Secondary | ICD-10-CM

## 2021-05-22 DIAGNOSIS — M25662 Stiffness of left knee, not elsewhere classified: Secondary | ICD-10-CM

## 2021-05-22 DIAGNOSIS — M25562 Pain in left knee: Secondary | ICD-10-CM

## 2021-05-22 DIAGNOSIS — M6281 Muscle weakness (generalized): Secondary | ICD-10-CM | POA: Diagnosis not present

## 2021-05-22 NOTE — Therapy (Signed)
?OUTPATIENT PHYSICAL THERAPY TREATMENT NOTE ? ? ?Patient Name: Krista Miles ?MRN: [id number] ?DOB:1955-12-05, 66 y.o., female ?Today's Date: 05/22/2021 ? ?PCP: Maximino Greenland, MD  ?REFERRING PROVIDER: Meredith Pel, MD  ? ?END OF SESSION:  ? PT End of Session - 05/22/21 1059   ? ? Visit Number 2   ? Number of Visits 12   ? Date for PT Re-Evaluation 06/21/21   ? Authorization Type [employer] Federal $30 copay   ? PT Start Time 1059   ? PT Stop Time 2831   ? PT Time Calculation (min) 43 min   ? Activity Tolerance Patient tolerated treatment well   ? Behavior During Therapy [facility] for tasks assessed/performed   ? ?  ?  ? ?  ? ? ?Past Medical History:  ?Diagnosis Date  ? Back pain   ? Hyperlipidemia   ? Prediabetes   ? Vitamin D deficiency   ? ?Past Surgical History:  ?Procedure Laterality Date  ? CESAREAN SECTION    ? HAND SURGERY Bilateral 2016  ? TRIGGER FINGER RELEASE Left 10/04/2017  ? Dr. Alphonzo Severance  ? TRIGGER FINGER RELEASE Right 05/06/2018  ? right ring finger  ? ?Patient Active Problem List  ? Diagnosis Date Noted  ? Acute left-sided low back pain without sciatica 12/07/2017  ? Urinary frequency 12/07/2017  ? Hyperlipidemia 10/28/2017  ? Abnormal glucose 10/28/2017  ? ? ?REFERRING DIAG:  M25.562 (ICD-10-CM) - Left knee pain, unspecified chronicity  ? ?THERAPY DIAG:  ?Acute pain of left knee ? ?Stiffness of left knee, not elsewhere classified ? ?Muscle weakness (generalized) ? ?Other abnormalities of gait and mobility ? ?PERTINENT HISTORY: LBP, HLD ? ?PRECAUTIONS: None ? ?SUBJECTIVE: She has been doing exercises every other day. She works nights so when she gets up she does not always remember. She stands most of work then sits to rest. When she gets up it hurts in the back.  ? ?PAIN:  ?Are you having pain? Yes: NPRS scale: 7/10 this morning and over the last week lowest 7/10 and highest 10/10 ?Pain location: left knee posteriorly ?Pain description: throb & sharp ?Aggravating factors: initially standing up  after being up on it. Bending, standing long times.  ?Relieving factors: sit, rub, Aleve,  ? ? ?OBJECTIVE: (objective measures completed at initial evaluation unless otherwise dated) ? ?OBJECTIVE:  ?  ?DIAGNOSTIC FINDINGS: x-rays: Overall alignment is slight varus.  No significant joint space narrowing.  No acute fracture.   No spurring.  Patella well centered in the trochlear groove. ?  ?PATIENT SURVEYS:  ?05/10/21 FOTO 29 (predicted 34) ? ?  ?PALPATION: ?05/10/21: tenderness at medial tibial plateau on Rt, trigger points noted in adductors with pain at pes anserine bursa as well; no significant posterior knee tenderness today. ?  ?LE ROM: ?  ?Active ROM Right ?05/10/2021 Left ?05/10/2021  ?Knee flexion 132 109  ?Knee extension 0 0  ? (Blank rows = not tested) ?  ?Passive ROM Right ?05/10/2021 Left ?05/10/2021  ?Knee flexion   123  ?with pain  ?Knee extension      ? (Blank rows = not tested) ?  ?LE MMT: ?  ?MMT Right ?05/10/2021 Left ?05/10/2021  ?Knee flexion 4/5 3/5 ?With pain  ?Knee extension 5/5 4/5 ?With pain  ? (Blank rows = not tested) ?  ?GAIT: ?Distance walked: 100' ?Assistive device utilized: None ?Level of assistance: Modified independence ?Comments: Antalgic gait, decreased stance on Lt, decreased hip/knee flexion on Lt ?  ?  ?  ?TODAY'S  TREATMENT: ?05/22/2021 ?Therapeutic Exercise ?Nustep seat 7 level 6 with BLEs & BUEs for 10 min. ?Hamstring stretch seated leg straight w/strap DF 30 sec 2 reps ?Seated LLE SLR 10 reps with PT cueing on technique ?Seated Lt LAQ 10 reps with PT cueing on technique ?Standing Lt knee flexion 10 reps. ?Supine heel slide with strap 10 reps. PT cueing on technique. ? ?Added Hamstring stretch to HEP with PT reprinting HO and verbal cues.  Pt return demo & verbalized understanding.  ? ?PT educated on dry needling including handout. Pt wants to think about it and may be do next session.  ?Ionto patch 73m dex to left medial hamstring distally. PT instructed in leaving 4-6hrs and checking skin  after removal. Pt verbalized understanding.  ? ?05/10/21 ?Therex: see HEP; pt performed trial reps PRN with mod cues for technique ?  ?  ?PATIENT EDUCATION:  ?Education details: HEP ?Person educated: Patient ?Education method: Explanation, Demonstration, and Handouts ?Education comprehension: verbalized understanding, returned demonstration, and needs further education ?  ?  ?HOME EXERCISE PROGRAM: ?Access Code: [email or web address] ?URL: [email or web address] ?Date: 05/10/2021 ?Prepared by: SFaustino Congress?  ?Exercises ?- Seated Knee Extension AROM  - 1-2 x daily - 7 x weekly - 1-2 sets - 10 reps - 3-5 sec hold ?- Seated Straight Leg Raise  - 1-2 x daily - 7 x weekly - 1-2 sets - 10 reps - 2-3 sec hold ?- Supine Heel Slide with Strap  - 1-2 x daily - 7 x weekly - 1-2 sets - 10 reps ?- Standing Knee Flexion  - 1-2 x daily - 7 x weekly - 1-2 sets - 10 reps ?  ?ASSESSMENT: ?  ?CLINICAL IMPRESSION: ?PT reviewed HEP with correctional cues and added hamstring stretch. She appears to have better understanding.  PT applied ionto to medial distal hamstring left leg which should help with her pain.  ?  ?  ?OBJECTIVE IMPAIRMENTS Abnormal gait, decreased mobility, difficulty walking, decreased ROM, decreased strength, increased fascial restrictions, increased muscle spasms, and pain.  ?  ?ACTIVITY LIMITATIONS cleaning, community activity, driving, meal prep, occupation, laundry, and shopping.  ?  ?PERSONAL FACTORS  None    ?  ?  ?REHAB POTENTIAL: Good ?  ?CLINICAL DECISION MAKING: Stable/uncomplicated ?  ?EVALUATION COMPLEXITY: Low ?  ?  ?GOALS: ?Goals reviewed with patient? Yes ?  ?SHORT TERM GOALS: Target date: 05/31/2021 ?  ?Independent with initial HEP ?Goal status: INITIAL ?  ?  ?LONG TERM GOALS: Target date: 06/21/2021 ?  ?Independent with final HEP ?Goal status: INITIAL ?  ?2.  FOTO score improved to 70 ?Goal status: INITIAL ?  ?3.  Lt knee AROM improved to 0-130 for improved motion and mobility ?Goal status:  INIITAL ?  ?4.  Report pain < 3/10 with standing/work activities for improved function ?Goal status: INITIAL ?  ?5.  Lt knee strength improved to at least 4+/5 without increase in pain for improved function ?Goal status: INITIAL ?  ?  ?  ?PLAN: ?PT FREQUENCY: 1-2x/week ?  ?PT DURATION: 6 weeks ?  ?PLANNED INTERVENTIONS: Therapeutic exercises, Therapeutic activity, Neuromuscular re-education, Balance training, Gait training, Patient/Family education, Joint mobilization, Aquatic Therapy, Dry Needling, Electrical stimulation, Cryotherapy, Moist heat, Taping, Ultrasound, Ionotophoresis '4mg'$ /ml Dexamethasone, and Manual therapy ?  ?PLAN FOR NEXT SESSION: check on ionto if helped, consider dry needling,  work on qGeographical information systems officer& flexibility, modalities PRN for pain ? ? ?RJamey Reas PT, DPT ?05/22/2021, 12:55 PM ? ?   ?

## 2021-05-24 ENCOUNTER — Ambulatory Visit: Payer: Federal, State, Local not specified - PPO | Admitting: Rehabilitative and Restorative Service Providers"

## 2021-05-24 ENCOUNTER — Encounter: Payer: Self-pay | Admitting: Rehabilitative and Restorative Service Providers"

## 2021-05-24 DIAGNOSIS — M25662 Stiffness of left knee, not elsewhere classified: Secondary | ICD-10-CM | POA: Diagnosis not present

## 2021-05-24 DIAGNOSIS — R2689 Other abnormalities of gait and mobility: Secondary | ICD-10-CM | POA: Diagnosis not present

## 2021-05-24 DIAGNOSIS — M6281 Muscle weakness (generalized): Secondary | ICD-10-CM

## 2021-05-24 DIAGNOSIS — M25562 Pain in left knee: Secondary | ICD-10-CM

## 2021-05-24 NOTE — Therapy (Signed)
OUTPATIENT PHYSICAL THERAPY TREATMENT NOTE   Patient Name: Krista Miles MRN: [id number] DOB:08/26/1955, 66 y.o., female Today's Date: 05/24/2021  PCP: Maximino Greenland, MD  REFERRING PROVIDER: Meredith Pel, MD   END OF SESSION:   PT End of Session - 05/24/21 1032     Visit Number 3    Number of Visits 12    Date for PT Re-Evaluation 06/21/21    Authorization Type [employer] $30 copay    PT Start Time 1030    PT Stop Time 1058    PT Time Calculation (min) 28 min    Activity Tolerance Patient tolerated treatment well    Behavior During Therapy WFL for tasks assessed/performed              Past Medical History:  Diagnosis Date   Back pain    Hyperlipidemia    Prediabetes    Vitamin D deficiency    Past Surgical History:  Procedure Laterality Date   CESAREAN SECTION     HAND SURGERY Bilateral 2016   TRIGGER FINGER RELEASE Left 10/04/2017   Dr. Alphonzo Severance   TRIGGER FINGER RELEASE Right 05/06/2018   right ring finger   Patient Active Problem List   Diagnosis Date Noted   Acute left-sided low back pain without sciatica 12/07/2017   Urinary frequency 12/07/2017   Hyperlipidemia 10/28/2017   Abnormal glucose 10/28/2017    REFERRING DIAG:  M25.562 (ICD-10-CM) - Left knee pain, unspecified chronicity   THERAPY DIAG:  Acute pain of left knee  Stiffness of left knee, not elsewhere classified  Muscle weakness (generalized)  Other abnormalities of gait and mobility  PERTINENT HISTORY: LBP, HLD  PRECAUTIONS: None  SUBJECTIVE: She has been doing exercises every other day. She works nights so when she gets up she does not always remember. She stands most of work then sits to rest. When she gets up it hurts in the back.   PAIN:  NPRS scale:7/10 at worst in last few days, 4/10 upon arrival.  Pain location: left knee posteriorly Pain description: throb & sharp Aggravating factors: work standing/walking   Relieving factors: sit, rub, Aleve,     OBJECTIVE: (objective measures completed at initial evaluation unless otherwise dated)  OBJECTIVE:    DIAGNOSTIC FINDINGS: x-rays: Overall alignment is slight varus.  No significant joint space narrowing.  No acute fracture.   No spurring.  Patella well centered in the trochlear groove.   PATIENT SURVEYS:  05/10/21 FOTO 61 (predicted 70)    PALPATION: 05/10/21: tenderness at medial tibial plateau on Rt, trigger points noted in adductors with pain at pes anserine bursa as well; no significant posterior knee tenderness today.   LE ROM:   Active ROM Right 05/10/2021 Left 05/10/2021  Knee flexion 132 109  Knee extension 0 0   (Blank rows = not tested)   Passive ROM Right 05/10/2021 Left 05/10/2021  Knee flexion   123  with pain  Knee extension       (Blank rows = not tested)   LE MMT:   MMT Right 05/10/2021 Left 05/10/2021  Knee flexion 4/5 3/5 With pain  Knee extension 5/5 4/5 With pain   (Blank rows = not tested)   GAIT: 05/10/2021 Distance walked: 100' Assistive device utilized: None Level of assistance: Modified independence Comments: Antalgic gait, decreased stance on Lt, decreased hip/knee flexion on Lt       TODAY'S TREATMENT: 05/24/2021 Therapeutic Exercise Supine Lt leg isometric push into table 15 sec x 6 (  sub max, sub pain) Supine hamstring stretch c strap c contralateral SLR 6 inch off table 3 x 10, performed bilateral Supine bridge 2 x 10, performed in mid range knee flexion and increased knee flexion 10 each  Seated SLR 2 x 10 bilateral  Ionto  patch 79m dex to left medial hamstring distally. PT instructed in leaving 4-6hrs 05/22/2021 Therapeutic Exercise Nustep seat 7 level 6 with BLEs & BUEs for 10 min. Hamstring stretch seated leg straight w/strap DF 30 sec 2 reps Seated LLE SLR 10 reps with PT cueing on technique Seated Lt LAQ 10 reps with PT cueing on technique Standing Lt knee flexion 10 reps. Supine heel slide with strap 10 reps. PT cueing on  technique.  Added Hamstring stretch to HEP with PT reprinting HO and verbal cues.  Pt return demo & verbalized understanding.   PT educated on dry needling including handout. Pt wants to think about it and may be do next session.  Ionto patch 154mdex to left medial hamstring distally. PT instructed in leaving 4-6hrs and checking skin after removal. Pt verbalized understanding.   05/10/21 Therex: see HEP; pt performed trial reps PRN with mod cues for technique     PATIENT EDUCATION:  Education details: HEP Person educated: Patient Education method: Explanation, Demonstration, and Handouts Education comprehension: verbalized understanding, returned demonstration, and needs further education     HOME EXERCISE PROGRAM: Access Code: [email or web address]: [email or web address] Date: 05/10/2021 Prepared by: StFaustino Congress Exercises - Seated Knee Extension AROM  - 1-2 x daily - 7 x weekly - 1-2 sets - 10 reps - 3-5 sec hold - Seated Straight Leg Raise  - 1-2 x daily - 7 x weekly - 1-2 sets - 10 reps - 2-3 sec hold - Supine Heel Slide with Strap  - 1-2 x daily - 7 x weekly - 1-2 sets - 10 reps - Standing Knee Flexion  - 1-2 x daily - 7 x weekly - 1-2 sets - 10 reps   ASSESSMENT:   CLINICAL IMPRESSION: Reduced treatment time due to arrival time.  Focused on hamstring muscle/tendon loading in isometric/eccentric control activity below pain threshold.  Pt continue to report pain from posterior medial aspect of Lt knee that worsens c prolonged standing/walking , particularly at work.      OBJECTIVE IMPAIRMENTS Abnormal gait, decreased mobility, difficulty walking, decreased ROM, decreased strength, increased fascial restrictions, increased muscle spasms, and pain.    ACTIVITY LIMITATIONS cleaning, community activity, driving, meal prep, occupation, laundry, and shopping.    PERSONAL FACTORS  None        REHAB POTENTIAL: Good   CLINICAL DECISION MAKING: Stable/uncomplicated    EVALUATION COMPLEXITY: Low     GOALS: Goals reviewed with patient? Yes   SHORT TERM GOALS: Target date: 05/31/2021   Independent with initial HEP Goal status: on going - assessed 05/24/2021     LONG TERM GOALS: Target date: 06/21/2021   Independent with final HEP Goal status: INITIAL   2.  FOTO score improved to 70 Goal status: INITIAL   3.  Lt knee AROM improved to 0-130 for improved motion and mobility Goal status: INIITAL   4.  Report pain < 3/10 with standing/work activities for improved function Goal status: INITIAL   5.  Lt knee strength improved to at least 4+/5 without increase in pain for improved function Goal status: INITIAL       PLAN: PT FREQUENCY: 1-2x/week   PT DURATION: 6 weeks   PLANNED INTERVENTIONS:  Therapeutic exercises, Therapeutic activity, Neuromuscular re-education, Balance training, Gait training, Patient/Family education, Joint mobilization, Aquatic Therapy, Dry Needling, Electrical stimulation, Cryotherapy, Moist heat, Taping, Ultrasound, Ionotophoresis '4mg'$ /ml Dexamethasone, and Manual therapy   PLAN FOR NEXT SESSION:  Check up information for MD visit.   Check on response from 2nd round of ionto use.    Scot Jun, PT, DPT, OCS, ATC 05/24/21  10:59 AM

## 2021-05-28 ENCOUNTER — Ambulatory Visit: Payer: Federal, State, Local not specified - PPO | Admitting: Physical Therapy

## 2021-05-28 ENCOUNTER — Encounter: Payer: Self-pay | Admitting: Physical Therapy

## 2021-05-28 DIAGNOSIS — R2689 Other abnormalities of gait and mobility: Secondary | ICD-10-CM

## 2021-05-28 DIAGNOSIS — M25562 Pain in left knee: Secondary | ICD-10-CM

## 2021-05-28 DIAGNOSIS — M25662 Stiffness of left knee, not elsewhere classified: Secondary | ICD-10-CM

## 2021-05-28 DIAGNOSIS — M6281 Muscle weakness (generalized): Secondary | ICD-10-CM | POA: Diagnosis not present

## 2021-05-28 NOTE — Therapy (Signed)
OUTPATIENT PHYSICAL THERAPY TREATMENT NOTE   Patient Name: Krista Miles MRN: [id number] DOB:October 05, 1955, 66 y.o., female Today's Date: 05/28/2021  PCP: Maximino Greenland, MD  REFERRING PROVIDER: Meredith Pel, MD   END OF SESSION:   PT End of Session - 05/28/21 1211     Visit Number 4    Number of Visits 12    Date for PT Re-Evaluation 06/21/21    Authorization Type [employer] Federal $30 copay    PT Start Time 1021    PT Stop Time 1100    PT Time Calculation (min) 39 min    Activity Tolerance Patient tolerated treatment well    Behavior During Therapy WFL for tasks assessed/performed              Past Medical History:  Diagnosis Date   Back pain    Hyperlipidemia    Prediabetes    Vitamin D deficiency    Past Surgical History:  Procedure Laterality Date   CESAREAN SECTION     HAND SURGERY Bilateral 2016   TRIGGER FINGER RELEASE Left 10/04/2017   Dr. Alphonzo Severance   TRIGGER FINGER RELEASE Right 05/06/2018   right ring finger   Patient Active Problem List   Diagnosis Date Noted   Acute left-sided low back pain without sciatica 12/07/2017   Urinary frequency 12/07/2017   Hyperlipidemia 10/28/2017   Abnormal glucose 10/28/2017    REFERRING DIAG:  M25.562 (ICD-10-CM) - Left knee pain, unspecified chronicity   THERAPY DIAG:  Acute pain of left knee  Stiffness of left knee, not elsewhere classified  Muscle weakness (generalized)  Other abnormalities of gait and mobility  PERTINENT HISTORY: LBP, HLD  PRECAUTIONS: None  SUBJECTIVE: She says the pain is in the back of her knee, it does not bother her at rest today upon arrival but bothers her with standing or with work  PAIN:  NPRS scale:7/10 at worst in last few days, 0/10 upon arrival.  Pain location: left knee posteriorly Pain description: throb & sharp Aggravating factors: work standing/walking   Relieving factors: sit, rub, Aleve,    OBJECTIVE: (objective measures completed at initial  evaluation unless otherwise dated)  OBJECTIVE:    DIAGNOSTIC FINDINGS: x-rays: Overall alignment is slight varus.  No significant joint space narrowing.  No acute fracture.   No spurring.  Patella well centered in the trochlear groove.   PATIENT SURVEYS:  05/10/21 FOTO 61 (predicted 70)    PALPATION: 05/10/21: tenderness at medial tibial plateau on Rt, trigger points noted in adductors with pain at pes anserine bursa as well; no significant posterior knee tenderness today.   LE ROM:   Active ROM Right 05/10/2021 Left 05/10/2021  Knee flexion 132 109  Knee extension 0 0   (Blank rows = not tested)   Passive ROM Right 05/10/2021 Left 05/10/2021  Knee flexion   123  with pain  Knee extension       (Blank rows = not tested)   LE MMT:   MMT Right 05/10/2021 Left 05/10/2021  Knee flexion 4/5 3/5 With pain  Knee extension 5/5 4/5 With pain   (Blank rows = not tested)   GAIT: 05/10/2021 Distance walked: 100' Assistive device utilized: None Level of assistance: Modified independence Comments: Antalgic gait, decreased stance on Lt, decreased hip/knee flexion on Lt       TODAY'S TREATMENT: 05/28/21 Nu step X 8 min L5 UE/LE Seated SLR 2X10 bilat Seated hamstring curl red 2X10 bilat Seated hamstring stretch 30 sec X 3  on Lt  Manual therapy: STM and IASTM with tool and with thera gun to Left hamstring and gastroc  05/24/2021 Therapeutic Exercise Supine Lt leg isometric push into table 15 sec x 6 (sub max, sub pain) Supine hamstring stretch c strap c contralateral SLR 6 inch off table 3 x 10, performed bilateral Supine bridge 2 x 10, performed in mid range knee flexion and increased knee flexion 10 each  Seated SLR 2 x 10 bilateral  Ionto  patch 92m dex to left medial hamstring distally. PT instructed in leaving 4-6hrs 05/22/2021 Therapeutic Exercise Nustep seat 7 level 6 with BLEs & BUEs for 10 min. Hamstring stretch seated leg straight w/strap DF 30 sec 2 reps Seated LLE SLR 10  reps with PT cueing on technique Seated Lt LAQ 10 reps with PT cueing on technique Standing Lt knee flexion 10 reps. Supine heel slide with strap 10 reps. PT cueing on technique.  Added Hamstring stretch to HEP with PT reprinting HO and verbal cues.  Pt return demo & verbalized understanding.   PT educated on dry needling including handout. Pt wants to think about it and may be do next session.  Ionto patch 140mdex to left medial hamstring distally. PT instructed in leaving 4-6hrs and checking skin after removal. Pt verbalized understanding.      PATIENT EDUCATION:  Education details: HEP Person educated: Patient Education method: ExConsulting civil engineerDeMedia plannerand Handouts Education comprehension: verbalized understanding, returned demonstration, and needs further education     HOME EXERCISE PROGRAM: Access Code: [email or web address]: [email or web address] Date: 05/10/2021 Prepared by: StFaustino Congress Exercises - Seated Knee Extension AROM  - 1-2 x daily - 7 x weekly - 1-2 sets - 10 reps - 3-5 sec hold - Seated Straight Leg Raise  - 1-2 x daily - 7 x weekly - 1-2 sets - 10 reps - 2-3 sec hold - Supine Heel Slide with Strap  - 1-2 x daily - 7 x weekly - 1-2 sets - 10 reps - Standing Knee Flexion  - 1-2 x daily - 7 x weekly - 1-2 sets - 10 reps   ASSESSMENT:   CLINICAL IMPRESSION: She still has some pain and tightness in her Lt hamstring. I feel DN could be beneficial however she is apprehensive to this intervention and did not want this performed. Instead I performed IASTM to her hamstring and gastroc and this did appear to help with her pain there upon standing and walking out at end of session. We held off on Ionto today in order to further examine the effectiveness of IASTM treatment.     OBJECTIVE IMPAIRMENTS Abnormal gait, decreased mobility, difficulty walking, decreased ROM, decreased strength, increased fascial restrictions, increased muscle spasms, and pain.     ACTIVITY LIMITATIONS cleaning, community activity, driving, meal prep, occupation, laundry, and shopping.    PERSONAL FACTORS  None        REHAB POTENTIAL: Good   CLINICAL DECISION MAKING: Stable/uncomplicated   EVALUATION COMPLEXITY: Low     GOALS: Goals reviewed with patient? Yes   SHORT TERM GOALS: Target date: 05/31/2021   Independent with initial HEP Goal status: on going - assessed 05/24/2021     LONG TERM GOALS: Target date: 06/21/2021   Independent with final HEP Goal status: INITIAL   2.  FOTO score improved to 70 Goal status: INITIAL   3.  Lt knee AROM improved to 0-130 for improved motion and mobility Goal status: INIITAL   4.  Report pain < 3/10 with  standing/work activities for improved function Goal status: INITIAL   5.  Lt knee strength improved to at least 4+/5 without increase in pain for improved function Goal status: INITIAL       PLAN: PT FREQUENCY: 1-2x/week   PT DURATION: 6 weeks   PLANNED INTERVENTIONS: Therapeutic exercises, Therapeutic activity, Neuromuscular re-education, Balance training, Gait training, Patient/Family education, Joint mobilization, Aquatic Therapy, Dry Needling, Electrical stimulation, Cryotherapy, Moist heat, Taping, Ultrasound, Ionotophoresis '4mg'$ /ml Dexamethasone, and Manual therapy   PLAN FOR NEXT SESSION:  IASTM if helpful from last time, gentle H.S. activation and stretching.  Elsie Ra, PT, DPT 05/28/21 12:19 PM

## 2021-05-30 ENCOUNTER — Ambulatory Visit (INDEPENDENT_AMBULATORY_CARE_PROVIDER_SITE_OTHER): Payer: Federal, State, Local not specified - PPO | Admitting: Orthopedic Surgery

## 2021-05-30 ENCOUNTER — Telehealth: Payer: Self-pay | Admitting: Orthopedic Surgery

## 2021-05-30 ENCOUNTER — Ambulatory Visit: Payer: Self-pay

## 2021-05-30 ENCOUNTER — Ambulatory Visit: Payer: Federal, State, Local not specified - PPO | Admitting: Physical Therapy

## 2021-05-30 ENCOUNTER — Encounter: Payer: Self-pay | Admitting: Orthopedic Surgery

## 2021-05-30 ENCOUNTER — Ambulatory Visit (INDEPENDENT_AMBULATORY_CARE_PROVIDER_SITE_OTHER): Payer: Federal, State, Local not specified - PPO

## 2021-05-30 ENCOUNTER — Encounter: Payer: Self-pay | Admitting: Physical Therapy

## 2021-05-30 DIAGNOSIS — M25662 Stiffness of left knee, not elsewhere classified: Secondary | ICD-10-CM | POA: Diagnosis not present

## 2021-05-30 DIAGNOSIS — R2689 Other abnormalities of gait and mobility: Secondary | ICD-10-CM | POA: Diagnosis not present

## 2021-05-30 DIAGNOSIS — M25562 Pain in left knee: Secondary | ICD-10-CM

## 2021-05-30 DIAGNOSIS — M79642 Pain in left hand: Secondary | ICD-10-CM

## 2021-05-30 DIAGNOSIS — M79641 Pain in right hand: Secondary | ICD-10-CM

## 2021-05-30 DIAGNOSIS — M6281 Muscle weakness (generalized): Secondary | ICD-10-CM

## 2021-05-30 NOTE — Telephone Encounter (Signed)
Please order MRI scan left knee to evaluate for meniscal tear.  Thanks I will put that in my note as well

## 2021-05-30 NOTE — Telephone Encounter (Signed)
Please advise 

## 2021-05-30 NOTE — Telephone Encounter (Signed)
Pt was called and informed and stated understanding  

## 2021-05-30 NOTE — Telephone Encounter (Signed)
Pt was seen this morning for Pt. She would like to know since next tues is her last visit and it will be 6 visits can we go ahead and order the mri for this pt. She would like to be informed when referral is sent.   CB [phone]

## 2021-05-30 NOTE — Therapy (Signed)
OUTPATIENT PHYSICAL THERAPY TREATMENT NOTE   Patient Name: Krista Miles MRN: [id number] DOB:1955/04/04, 65 y.o., female Today's Date: 05/30/2021  PCP: Maximino Greenland, MD  REFERRING PROVIDER: Meredith Pel, MD   END OF SESSION:   PT End of Session - 05/30/21 1107     Visit Number 5    Number of Visits 12    Date for PT Re-Evaluation 06/21/21    Authorization Type [employer] Federal $30 copay    PT Start Time 1028   pt arrived late   PT Stop Time 1058    PT Time Calculation (min) 30 min    Activity Tolerance Patient tolerated treatment well    Behavior During Therapy WFL for tasks assessed/performed               Past Medical History:  Diagnosis Date   Back pain    Hyperlipidemia    Prediabetes    Vitamin D deficiency    Past Surgical History:  Procedure Laterality Date   CESAREAN SECTION     HAND SURGERY Bilateral 2016   TRIGGER FINGER RELEASE Left 10/04/2017   Dr. Alphonzo Severance   TRIGGER FINGER RELEASE Right 05/06/2018   right ring finger   Patient Active Problem List   Diagnosis Date Noted   Acute left-sided low back pain without sciatica 12/07/2017   Urinary frequency 12/07/2017   Hyperlipidemia 10/28/2017   Abnormal glucose 10/28/2017    REFERRING DIAG:  M25.562 (ICD-10-CM) - Left knee pain, unspecified chronicity   THERAPY DIAG:  Acute pain of left knee  Stiffness of left knee, not elsewhere classified  Muscle weakness (generalized)  Other abnormalities of gait and mobility  PERTINENT HISTORY: LBP, HLD  PRECAUTIONS: None  SUBJECTIVE: hamstring is still very sensitive, reports only 20% better  PAIN:  NPRS scale:2/10 at worst in last few days, 0/10 upon arrival.  Pain location: left knee posteriorly Pain description: throb & sharp Aggravating factors: work standing/walking   Relieving factors: sit, rub, Aleve    OBJECTIVE: (objective measures completed at initial evaluation unless otherwise dated)  DIAGNOSTIC FINDINGS: x-rays:  Overall alignment is slight varus.  No significant joint space narrowing.  No acute fracture.   No spurring.  Patella well centered in the trochlear groove.   PATIENT SURVEYS:  05/10/21 FOTO 61 (predicted 70) 05/30/21 FOTO 49    PALPATION: 05/10/21: tenderness at medial tibial plateau on Rt, trigger points noted in adductors with pain at pes anserine bursa as well; no significant posterior knee tenderness today.   LE ROM:   Active ROM Right 05/10/2021 Left 05/10/2021  Knee flexion 132 109  Knee extension 0 0   (Blank rows = not tested)   Passive ROM Right 05/10/2021 Left 05/10/2021  Knee flexion   123  with pain  Knee extension       (Blank rows = not tested)   LE MMT:   MMT Right 05/10/2021 Left 05/10/2021  Knee flexion 4/5 3/5 With pain  Knee extension 5/5 4/5 With pain   (Blank rows = not tested)   GAIT: 05/10/2021 Distance walked: 100' Assistive device utilized: None Level of assistance: Modified independence Comments: Antalgic gait, decreased stance on Lt, decreased hip/knee flexion on Lt       TODAY'S TREATMENT: 05/30/21 Manual: STM with IASTM and percussive device to Lt hamstring and adductor/gracilis, very tender at distal/medial hamstring and distal adductors.  Limited tolerance to percussive device.  Ionto: 9m patch to Lt medial hamstring distally, dexamethasone, 4-6 hour patch  05/28/21 Nu step X 8 min L5 UE/LE Seated SLR 2X10 bilat Seated hamstring curl red 2X10 bilat Seated hamstring stretch 30 sec X 3 on Lt  Manual therapy: STM and IASTM with tool and with thera gun to Left hamstring and gastroc  05/24/2021 Therapeutic Exercise Supine Lt leg isometric push into table 15 sec x 6 (sub max, sub pain) Supine hamstring stretch c strap c contralateral SLR 6 inch off table 3 x 10, performed bilateral Supine bridge 2 x 10, performed in mid range knee flexion and increased knee flexion 10 each  Seated SLR 2 x 10 bilateral  Ionto  patch 6m dex to left medial  hamstring distally. PT instructed in leaving 4-6hrs 05/22/2021 Therapeutic Exercise Nustep seat 7 level 6 with BLEs & BUEs for 10 min. Hamstring stretch seated leg straight w/strap DF 30 sec 2 reps Seated LLE SLR 10 reps with PT cueing on technique Seated Lt LAQ 10 reps with PT cueing on technique Standing Lt knee flexion 10 reps. Supine heel slide with strap 10 reps. PT cueing on technique.  Added Hamstring stretch to HEP with PT reprinting HO and verbal cues.  Pt return demo & verbalized understanding.   PT educated on dry needling including handout. Pt wants to think about it and may be do next session.  Ionto patch 152mdex to left medial hamstring distally. PT instructed in leaving 4-6hrs and checking skin after removal. Pt verbalized understanding.      PATIENT EDUCATION:  Education details: HEP Person educated: Patient Education method: ExConsulting civil engineerDeMedia plannerand Handouts Education comprehension: verbalized understanding, returned demonstration, and needs further education     HOME EXERCISE PROGRAM: Access Code: [email or web address]: [email or web address] Date: 05/10/2021 Prepared by: StFaustino Congress Exercises - Seated Knee Extension AROM  - 1-2 x daily - 7 x weekly - 1-2 sets - 10 reps - 3-5 sec hold - Seated Straight Leg Raise  - 1-2 x daily - 7 x weekly - 1-2 sets - 10 reps - 2-3 sec hold - Supine Heel Slide with Strap  - 1-2 x daily - 7 x weekly - 1-2 sets - 10 reps - Standing Knee Flexion  - 1-2 x daily - 7 x weekly - 1-2 sets - 10 reps   ASSESSMENT:   CLINICAL IMPRESSION: Pt arrived late to session today, so exercise limited today, and pt requesting manual and ionto patch today.  FOTO score has decreased today and she reports only 20% improvement.  She states pain is improved after sessions but returns quickly after returning to work.  Recommend she reach out to MD for next steps.   OBJECTIVE IMPAIRMENTS Abnormal gait, decreased mobility, difficulty  walking, decreased ROM, decreased strength, increased fascial restrictions, increased muscle spasms, and pain.    ACTIVITY LIMITATIONS cleaning, community activity, driving, meal prep, occupation, laundry, and shopping.    PERSONAL FACTORS  None        REHAB POTENTIAL: Good   CLINICAL DECISION MAKING: Stable/uncomplicated   EVALUATION COMPLEXITY: Low     GOALS: Goals reviewed with patient? Yes   SHORT TERM GOALS: Target date: 05/31/2021   Independent with initial HEP Goal status: on going - assessed 05/24/2021     LONG TERM GOALS: Target date: 06/21/2021   Independent with final HEP Goal status: INITIAL   2.  FOTO score improved to 70 Goal status: INITIAL   3.  Lt knee AROM improved to 0-130 for improved motion and mobility Goal status: INIITAL   4.  Report  pain < 3/10 with standing/work activities for improved function Goal status: INITIAL   5.  Lt knee strength improved to at least 4+/5 without increase in pain for improved function Goal status: INITIAL       PLAN: PT FREQUENCY: 1-2x/week   PT DURATION: 6 weeks   PLANNED INTERVENTIONS: Therapeutic exercises, Therapeutic activity, Neuromuscular re-education, Balance training, Gait training, Patient/Family education, Joint mobilization, Aquatic Therapy, Dry Needling, Electrical stimulation, Cryotherapy, Moist heat, Taping, Ultrasound, Ionotophoresis '4mg'$ /ml Dexamethasone, and Manual therapy   PLAN FOR NEXT SESSION:  continue per POC, IASTM if helpful from last time, gentle H.S. activation and stretching.    Laureen Abrahams, PT, DPT 05/30/21 11:12 AM

## 2021-05-30 NOTE — Progress Notes (Addendum)
Office Visit Note   Patient: Krista Miles           Date of Birth: June 15, 1955           MRN: [id number] Visit Date: 05/30/2021 Requested by: Glendale Chard, [address] [place],  [place] [address] PCP: Glendale Chard, MD  Subjective: Chief Complaint  Patient presents with   Left Hand - Pain   Right Hand - Pain    HPI: Patient presents for follow-up of bilateral hands.  She currently is in physical therapy for her left knee.  May or may not need MRI scanning depending on how she feels after physical therapy for the knee.  Both hands are hurting her with lifting at work.  She had been on a work duty regimen with less lifting where her hands are less symptomatic.  She has had surgeries on both hands including carpal tunnel release and de Quervain's tenosynovitis release.  Denies any neck pain.              ROS: All systems reviewed are negative as they relate to the chief complaint within the history of present illness.  Patient denies  fevers or chills.   Assessment & Plan: Visit Diagnoses:  1. Bilateral hand pain     Plan: Impression is bilateral hand pain with some weakness with gripping and lifting.  Plain radiographs unremarkable.  I think it be reasonable to give her a trial of no lifting more than 15 pounds for about 3 months and then see if she can get back to regular duty.  Follow-up with me in 3 months if she has not improved after a lifting hiatus at work.  She has also been in physical therapy for 6 weeks for her left knee with mild improvement in strength but no improvement in her symptoms.  Questions were after failure of conservative patient management recommend MRI scanning of the left knee to evaluate for pathology.  Follow-Up Instructions: No follow-ups on file.   Orders:  Orders Placed This Encounter  Procedures   XR Hand Complete Right   XR Hand Complete Left   No orders of the defined types were placed in this encounter.      Procedures: No procedures performed   Clinical Data: No additional findings.  Objective: Vital Signs: There were no vitals taken for this visit.  Physical Exam:   Constitutional: Patient appears well-developed HEENT:  Head: Normocephalic Eyes:EOM are normal Neck: Normal range of motion Cardiovascular: Normal rate Pulmonary/chest: Effort normal Neurologic: Patient is alert Skin: Skin is warm Psychiatric: Patient has normal mood and affect   Ortho Exam: Ortho exam demonstrates slightly diminished grip strength bilaterally.  Radial pulse intact.  No muscular wasting in the hand.  Wrist range of motion is full.  No snuffbox tenderness or radial tuberosity tenderness.  Finkelstein's test negative.  Specialty Comments:  No specialty comments available.  Imaging: XR Hand Complete Left  Result Date: 05/30/2021 AP lateral oblique radiographs left hand reviewed.  No acute fracture.  No arthritis.  Radiocarpal alignment intact.  Normal left hand radiographs .  XR Hand Complete Right  Result Date: 05/30/2021 AP lateral oblique radiographs right hand reviewed.  No acute fracture.  No arthritis.  Radiocarpal alignment intact.  Normal right hand radiographs    PMFS History: Patient Active Problem List   Diagnosis Date Noted   Acute left-sided low back pain without sciatica 12/07/2017   Urinary frequency 12/07/2017   Hyperlipidemia 10/28/2017  Abnormal glucose 10/28/2017   Past Medical History:  Diagnosis Date   Back pain    Hyperlipidemia    Prediabetes    Vitamin D deficiency     Family History  Problem Relation Age of Onset   Dementia Mother    Cancer Father    Leukemia Father    Cancer Maternal Aunt     Past Surgical History:  Procedure Laterality Date   CESAREAN SECTION     HAND SURGERY Bilateral 2016   TRIGGER FINGER RELEASE Left 10/04/2017   Dr. Alphonzo Severance   TRIGGER FINGER RELEASE Right 05/06/2018   right ring finger   Social History   Occupational  History   Occupation: [employer]  Tobacco Use   Smoking status: Never   Smokeless tobacco: Never  Vaping Use   Vaping Use: Never used  Substance and Sexual Activity   Alcohol use: No   Drug use: No   Sexual activity: Not on file

## 2021-05-30 NOTE — Addendum Note (Signed)
Addended by: Robyne Peers on: 05/30/2021 12:54 PM   Modules accepted: Orders

## 2021-06-05 ENCOUNTER — Encounter: Payer: Self-pay | Admitting: Physical Therapy

## 2021-06-05 ENCOUNTER — Ambulatory Visit (INDEPENDENT_AMBULATORY_CARE_PROVIDER_SITE_OTHER): Payer: Federal, State, Local not specified - PPO | Admitting: Physical Therapy

## 2021-06-05 DIAGNOSIS — M25562 Pain in left knee: Secondary | ICD-10-CM | POA: Diagnosis not present

## 2021-06-05 DIAGNOSIS — M6281 Muscle weakness (generalized): Secondary | ICD-10-CM

## 2021-06-05 DIAGNOSIS — M25662 Stiffness of left knee, not elsewhere classified: Secondary | ICD-10-CM | POA: Diagnosis not present

## 2021-06-05 DIAGNOSIS — R2689 Other abnormalities of gait and mobility: Secondary | ICD-10-CM

## 2021-06-05 NOTE — Therapy (Addendum)
OUTPATIENT PHYSICAL THERAPY TREATMENT NOTE DISCHARGE SUMMARY   Patient Name: Krista Miles MRN: [id number] DOB:1955-09-12, 66 y.o., female Today's Date: 06/05/2021  PCP: Maximino Greenland, MD  REFERRING PROVIDER: Meredith Pel, MD   END OF SESSION:   PT End of Session - 06/05/21 0938     Visit Number 6    Number of Visits 12    Date for PT Re-Evaluation 06/21/21    Authorization Type [employer] $30 copay    PT Start Time [phone]   pt arrived late   PT Stop Time 1010    PT Time Calculation (min) 34 min    Activity Tolerance Patient tolerated treatment well    Behavior During Therapy WFL for tasks assessed/performed                Past Medical History:  Diagnosis Date   Back pain    Hyperlipidemia    Prediabetes    Vitamin D deficiency    Past Surgical History:  Procedure Laterality Date   CESAREAN SECTION     HAND SURGERY Bilateral 2016   TRIGGER FINGER RELEASE Left 10/04/2017   Dr. Alphonzo Severance   TRIGGER FINGER RELEASE Right 05/06/2018   right ring finger   Patient Active Problem List   Diagnosis Date Noted   Acute left-sided low back pain without sciatica 12/07/2017   Urinary frequency 12/07/2017   Hyperlipidemia 10/28/2017   Abnormal glucose 10/28/2017    REFERRING DIAG:  M25.562 (ICD-10-CM) - Left knee pain, unspecified chronicity   THERAPY DIAG:  Acute pain of left knee  Stiffness of left knee, not elsewhere classified  Muscle weakness (generalized)  Other abnormalities of gait and mobility  PERTINENT HISTORY: LBP, HLD  PRECAUTIONS: None  SUBJECTIVE: has MRI scheduled for 06/16/21, knee is still bothering her; up to 7/10 with standing/work activities  PAIN:  NPRS scale:7/10 at worst in last few days Pain location: left knee posteriorly Pain description: throb & sharp Aggravating factors: work standing/walking   Relieving factors: sit, rub, Aleve    OBJECTIVE: (objective measures completed at initial evaluation unless otherwise  dated)  DIAGNOSTIC FINDINGS: x-rays: Overall alignment is slight varus.  No significant joint space narrowing.  No acute fracture.   No spurring.  Patella well centered in the trochlear groove.   PATIENT SURVEYS:  05/10/21 FOTO 61 (predicted 70) 05/30/21 FOTO 49    PALPATION: 05/10/21: tenderness at medial tibial plateau on Rt, trigger points noted in adductors with pain at pes anserine bursa as well; no significant posterior knee tenderness today.   LE ROM:   Active ROM Right 05/10/2021 Left 05/10/2021 Left 06/05/21  Knee flexion 132 109 125  Knee extension 0 0 0   (Blank rows = not tested)   Passive ROM Right 05/10/2021 Left 05/10/2021  Knee flexion   123  with pain  Knee extension       (Blank rows = not tested)   LE MMT:   MMT Right 05/10/2021 Left 05/10/2021 Left 06/05/21  Knee flexion 4/5 3/5 With pain 3/5  With pain  Knee extension 5/5 4/5 With pain 5/5   (Blank rows = not tested)   GAIT: 05/10/2021 Distance walked: 100' Assistive device utilized: None Level of assistance: Modified independence Comments: Antalgic gait, decreased stance on Lt, decreased hip/knee flexion on Lt       TODAY'S TREATMENT: 06/05/21 Therex:      Aerobic: Bike L3 x 8 min     Sitting: Seated HS stretch on Lt 3x30  sec SLR on Lt 2x10 LAQ 5 sec hold 2x10     Standing: Standing HS curl on Lt 2x10   05/30/21 Manual: STM with IASTM and percussive device to Lt hamstring and adductor/gracilis, very tender at distal/medial hamstring and distal adductors.  Limited tolerance to percussive device.  Ionto: 75m patch to Lt medial hamstring distally, dexamethasone, 4-6 hour patch  05/28/21 Nu step X 8 min L5 UE/LE Seated SLR 2X10 bilat Seated hamstring curl red 2X10 bilat Seated hamstring stretch 30 sec X 3 on Lt  Manual therapy: STM and IASTM with tool and with thera gun to Left hamstring and gastroc  05/24/2021 Therapeutic Exercise Supine Lt leg isometric push into table 15 sec x 6 (sub max,  sub pain) Supine hamstring stretch c strap c contralateral SLR 6 inch off table 3 x 10, performed bilateral Supine bridge 2 x 10, performed in mid range knee flexion and increased knee flexion 10 each  Seated SLR 2 x 10 bilateral  Ionto  patch 172mdex to left medial hamstring distally. PT instructed in leaving 4-6hrs 05/22/2021 Therapeutic Exercise Nustep seat 7 level 6 with BLEs & BUEs for 10 min. Hamstring stretch seated leg straight w/strap DF 30 sec 2 reps Seated LLE SLR 10 reps with PT cueing on technique Seated Lt LAQ 10 reps with PT cueing on technique Standing Lt knee flexion 10 reps. Supine heel slide with strap 10 reps. PT cueing on technique.  Added Hamstring stretch to HEP with PT reprinting HO and verbal cues.  Pt return demo & verbalized understanding.   PT educated on dry needling including handout. Pt wants to think about it and may be do next session.  Ionto patch 62m50mex to left medial hamstring distally. PT instructed in leaving 4-6hrs and checking skin after removal. Pt verbalized understanding.      PATIENT EDUCATION:  Education details: HEP Person educated: Patient Education method: ExpConsulting civil engineeremMedia plannernd Handouts Education comprehension: verbalized understanding, returned demonstration, and needs further education     HOME EXERCISE PROGRAM: Access Code: [email or web address]: [email or web address] Date: 06/05/2021 Prepared by: SteFaustino Congressxercises - Seated Hamstring Stretch  - 2 x daily - 7 x weekly - 1 sets - 3 reps - 30 sec hold - Seated Straight Leg Raise  - 1-2 x daily - 7 x weekly - 1-2 sets - 10 reps - 3-5 sec hold - Standing Knee Flexion  - 1-2 x daily - 7 x weekly - 1-2 sets - 10 reps - Seated Knee Extension AROM  - 1-2 x daily - 7 x weekly - 1-2 sets - 10 reps - 5 sec hold   ASSESSMENT:   CLINICAL IMPRESSION: Pt has not met any goals at this time to date, needing mod cues for HEP instruction.  Did update HEP at Lt knee  flexion is improved but not quite to goal.  Pt requesting to hold PT for now, as she has MRI scheduled.  Will return if indicated after MRI, or will d/c if pt doesn't return.    OBJECTIVE IMPAIRMENTS Abnormal gait, decreased mobility, difficulty walking, decreased ROM, decreased strength, increased fascial restrictions, increased muscle spasms, and pain.    ACTIVITY LIMITATIONS cleaning, community activity, driving, meal prep, occupation, laundry, and shopping.    PERSONAL FACTORS  None        REHAB POTENTIAL: Good   CLINICAL DECISION MAKING: Stable/uncomplicated   EVALUATION COMPLEXITY: Low     GOALS: Goals reviewed with patient? Yes   SHORT TERM GOALS:  Target date: 05/31/2021   Independent with initial HEP Goal status: not met 06/05/21     LONG TERM GOALS: Target date: 06/21/2021   Independent with final HEP Goal status: ongoing 06/05/21   2.  FOTO score improved to 70 Goal status: Improved, ongoing 06/05/21   3.  Lt knee AROM improved to 0-130 for improved motion and mobility Goal status: ongoing, improved 06/05/21   4.  Report pain < 3/10 with standing/work activities for improved function Goal status: ongoing, 06/05/21   5.  Lt knee strength improved to at least 4+/5 without increase in pain for improved function Goal status: ongoing 06/05/21       PLAN: PT FREQUENCY: 1-2x/week   PT DURATION: 6 weeks   PLANNED INTERVENTIONS: Therapeutic exercises, Therapeutic activity, Neuromuscular re-education, Balance training, Gait training, Patient/Family education, Joint mobilization, Aquatic Therapy, Dry Needling, Electrical stimulation, Cryotherapy, Moist heat, Taping, Ultrasound, Ionotophoresis 73m/ml Dexamethasone, and Manual therapy   PLAN FOR NEXT SESSION:  will likely need recert if she returns, see what MRI and MD says, d/c if pt doesn't return. continue per POC, IASTM if helpful from last time, gentle H.S. activation and stretching.    SLaureen Abrahams PT,  DPT 06/05/21 10:26 AM      PHYSICAL THERAPY DISCHARGE SUMMARY  Visits from Start of Care: 6  Current functional level related to goals / functional outcomes: See above   Remaining deficits: See above   Education / Equipment: HEP   Patient agrees to discharge. Patient goals were not met. Patient is being discharged due to did not respond to therapy.    SLaureen Abrahams PT, DPT 08/07/21 8:30 AM  [facility] [address] [address]Phone: [phone]  Fax:  [phone]

## 2021-06-08 ENCOUNTER — Telehealth: Payer: Self-pay | Admitting: Orthopedic Surgery

## 2021-06-08 NOTE — Telephone Encounter (Signed)
Called patient left message to return call to schedule an appointment for MRI review with Dr. Marlou Sa MRI is scheduled for 06/16/2021

## 2021-06-11 ENCOUNTER — Encounter: Payer: Federal, State, Local not specified - PPO | Admitting: Internal Medicine

## 2021-06-16 ENCOUNTER — Ambulatory Visit
Admission: RE | Admit: 2021-06-16 | Discharge: 2021-06-16 | Disposition: A | Discharge status: Home / Self Care | Payer: Federal, State, Local not specified - PPO | Source: Ambulatory Visit | Attending: Orthopedic Surgery | Admitting: Orthopedic Surgery

## 2021-06-16 DIAGNOSIS — M25562 Pain in left knee: Secondary | ICD-10-CM

## 2021-06-19 ENCOUNTER — Ambulatory Visit: Payer: Federal, State, Local not specified - PPO | Admitting: Surgical

## 2021-06-19 ENCOUNTER — Encounter: Payer: Self-pay | Admitting: Surgical

## 2021-06-19 DIAGNOSIS — S83207D Unspecified tear of unspecified meniscus, current injury, left knee, subsequent encounter: Secondary | ICD-10-CM

## 2021-06-19 MED ORDER — CELECOXIB 100 MG PO CAPS: 100.0000 mg | ORAL_CAPSULE | Freq: Two times a day (BID) | ORAL | 2 refills | Status: DC

## 2021-06-19 NOTE — Progress Notes (Signed)
Office Visit Note   Patient: Krista Miles           Date of Birth: 12-09-1955           MRN: [id number] Visit Date: 06/19/2021 Requested by: Glendale Chard, [address] [place],  [place] [address] PCP: Glendale Chard, MD  Subjective: Chief Complaint  Patient presents with   Other    Scan review    HPI: Krista Miles is a 66 y.o. female who presents to the office for MRI review. Patient denies any changes in symptoms.  Continues to complain mainly of posterior medial left knee pain.  Taking Advil for symptoms.  She works at the [employer] but involves a lot of walking and bending down/stooping to pick things up.  She is planning on retiring in August.  She has never had problems with this knee up until about 6 months ago.  MRI results revealed: MR Knee Left w/o contrast  Result Date: 06/19/2021 CLINICAL DATA:  Left knee pain radiating to the calf. EXAM: MRI OF THE LEFT KNEE WITHOUT CONTRAST TECHNIQUE: Multiplanar, multisequence MR imaging of the knee was performed. No intravenous contrast was administered. COMPARISON:  None Available. FINDINGS: MENISCI Medial: Complex tear of the posterior horn of the medial meniscus towards the meniscal root with a radial component and peripheral meniscal extrusion. Lateral: Intact. LIGAMENTS Cruciates: ACL and PCL are intact. Collaterals: Medial collateral ligament is intact. Lateral collateral ligament complex is intact. CARTILAGE Patellofemoral: Mild partial-thickness cartilage loss of the trochlear groove. Medial: High-grade partial-thickness cartilage loss of the medial femorotibial compartment with areas of full-thickness cartilage loss of the medial femoral condyle. Lateral: Mild partial-thickness cartilage loss of the lateral femorotibial compartment. JOINT: Large joint effusion. Normal Hoffa's fat-pad. No plical thickening. POPLITEAL FOSSA: Popliteus tendon is intact. Small Baker's cyst. EXTENSOR MECHANISM: Intact quadriceps  tendon. Intact patellar tendon. Intact lateral patellar retinaculum. Intact medial patellar retinaculum. Intact MPFL. BONES: No aggressive osseous lesion. No fracture or dislocation. Other: No fluid collection or hematoma. Muscles are normal. IMPRESSION: 1. Complex tear of the posterior horn of the medial meniscus towards the meniscal root with a radial component and peripheral meniscal extrusion. 2. Tricompartmental cartilage abnormalities as described above most severe in the medial femorotibial compartment. 3. Large joint effusion. Electronically Signed   By: Kathreen Devoid M.D.   On: 06/19/2021 08:43                 ROS: All systems reviewed are negative as they relate to the chief complaint within the history of present illness.  Patient denies fevers or chills.  Assessment & Plan: Visit Diagnoses:  1. Acute meniscal tear of left knee, subsequent encounter     Plan: Krista Miles is a 66 y.o. female who presents to the office for review of left knee MRI scan.  She has had left knee pain for about 6 months without acute event or injury.  She does recall a period of time in the early days back in January when she noticed she was not really able to put much weight on her left leg.  This prompted her to go to urgent care where she was told that she has arthritis and referred to see Dr. Marlou Sa.  Radiographs were fairly unremarkable and so MRI was obtained after she completed course of physical therapy.  She has continued pain that has not improved with physical therapy and MRI was reviewed today that demonstrates complex radial tear of the  posterior horn of the medial meniscus near the root with meniscal extrusion.  Appears to be type II meniscal root tear with tear about 6 mm from the meniscal root attachment.  She has had persistent pain that has not responded to physical therapy.  Suspect that when she had that earlier period of difficulty bearing weight on her leg, that was the moment in time when the  meniscus torn near the root.  Plan to try different oral anti-inflammatory other than Advil; Celebrex was prescribed.  Discussed the possibility of left knee arthroscopy with meniscal debridement versus meniscal root tear repair; based on MRI results and the presence of meniscal extrusion, suspect that patient would benefit most from root tear repair.  Plan to discuss this further with Dr. Marlou Sa regarding suitability for meniscal repair and then contact the patient for further plan by phone.  Patient agreed with this plan.  Follow-Up Instructions: No follow-ups on file.   Orders:  No orders of the defined types were placed in this encounter.  Meds ordered this encounter  Medications   celecoxib (CELEBREX) 100 MG capsule    Sig: Take 1 capsule (100 mg total) by mouth 2 (two) times daily.    Dispense:  60 capsule    Refill:  2      Procedures: No procedures performed   Clinical Data: No additional findings.  Objective: Vital Signs: There were no vitals taken for this visit.  Physical Exam:  Constitutional: Patient appears well-developed HEENT:  Head: Normocephalic Eyes:EOM are normal Neck: Normal range of motion Cardiovascular: Normal rate Pulmonary/chest: Effort normal Neurologic: Patient is alert Skin: Skin is warm Psychiatric: Patient has normal mood and affect  Ortho Exam: Ortho exam demonstrates left knee with trace effusion.  Tenderness over the posterior medial joint line.  No significant tenderness over the lateral joint line.  Able to perform straight leg raise without extensor lag.  Ambulates with slight antalgia.  0 degrees extension and 120 degrees of knee flexion.  No calf tenderness.  Negative Homans' sign.  No pain with hip range of motion.  Specialty Comments:  No specialty comments available.  Imaging: No results found.   PMFS History: Patient Active Problem List   Diagnosis Date Noted   Acute left-sided low back pain without sciatica 12/07/2017    Urinary frequency 12/07/2017   Hyperlipidemia 10/28/2017   Abnormal glucose 10/28/2017   Past Medical History:  Diagnosis Date   Back pain    Hyperlipidemia    Prediabetes    Vitamin D deficiency     Family History  Problem Relation Age of Onset   Dementia Mother    Cancer Father    Leukemia Father    Cancer Maternal Aunt     Past Surgical History:  Procedure Laterality Date   CESAREAN SECTION     HAND SURGERY Bilateral 2016   TRIGGER FINGER RELEASE Left 10/04/2017   Dr. Alphonzo Severance   TRIGGER FINGER RELEASE Right 05/06/2018   right ring finger   Social History   Occupational History   Occupation: [employer]  Tobacco Use   Smoking status: Never   Smokeless tobacco: Never  Vaping Use   Vaping Use: Never used  Substance and Sexual Activity   Alcohol use: No   Drug use: No   Sexual activity: Not on file

## 2021-06-22 ENCOUNTER — Telehealth: Payer: Self-pay | Admitting: Surgical

## 2021-06-22 NOTE — Telephone Encounter (Signed)
Please advise. Thank you

## 2021-06-22 NOTE — Telephone Encounter (Signed)
Pt called requesting a call back from [facility]. Pt states her left knee is swelling. Please call pt at [phone].

## 2021-06-26 ENCOUNTER — Telehealth: Payer: Self-pay | Admitting: Surgical

## 2021-06-26 NOTE — Telephone Encounter (Signed)
Pt called asking for medication to take fluid off her knee. Please call pt about this matter at [phone].

## 2021-06-26 NOTE — Telephone Encounter (Signed)
Dr Marlou Sa called, we will work her in next week for asp/inject which should help most

## 2021-06-26 NOTE — Telephone Encounter (Signed)
Dr dean called today thanks

## 2021-06-26 NOTE — Progress Notes (Signed)
I called.  Can you see if you can bring her in next week sometime just for aspiration and injection of the kneed.  She is fairly apprehensive about it but I talked about it with her at length.  Thanks.  That would be

## 2021-06-27 NOTE — Telephone Encounter (Signed)
Noted  

## 2021-06-27 NOTE — Telephone Encounter (Signed)
IC and scheduled 

## 2021-07-04 ENCOUNTER — Ambulatory Visit: Payer: Federal, State, Local not specified - PPO | Admitting: Orthopedic Surgery

## 2021-07-04 ENCOUNTER — Encounter: Payer: Self-pay | Admitting: Orthopedic Surgery

## 2021-07-04 ENCOUNTER — Telehealth: Payer: Self-pay | Admitting: Orthopedic Surgery

## 2021-07-04 ENCOUNTER — Other Ambulatory Visit: Payer: Self-pay | Admitting: Obstetrics and Gynecology

## 2021-07-04 DIAGNOSIS — S83207D Unspecified tear of unspecified meniscus, current injury, left knee, subsequent encounter: Secondary | ICD-10-CM

## 2021-07-04 DIAGNOSIS — Z1231 Encounter for screening mammogram for malignant neoplasm of breast: Secondary | ICD-10-CM

## 2021-07-04 DIAGNOSIS — M25562 Pain in left knee: Secondary | ICD-10-CM

## 2021-07-04 MED ORDER — BUPIVACAINE HCL 0.25 % IJ SOLN
4.0000 mL | INTRAMUSCULAR | Status: AC | PRN
  Administered 2021-07-04: 4 mL via INTRA_ARTICULAR

## 2021-07-04 MED ORDER — LIDOCAINE HCL 1 % IJ SOLN
5.0000 mL | INTRAMUSCULAR | Status: AC | PRN
  Administered 2021-07-04: 5 mL

## 2021-07-04 MED ORDER — METHYLPREDNISOLONE ACETATE 40 MG/ML IJ SUSP
40.0000 mg | INTRAMUSCULAR | Status: AC | PRN
  Administered 2021-07-04: 40 mg via INTRA_ARTICULAR

## 2021-07-04 NOTE — Telephone Encounter (Signed)
Pt called and was wondering if she can do aquatic therapy?

## 2021-07-04 NOTE — Progress Notes (Signed)
   Procedure Note  Patient: Krista Miles             Date of Birth: 1955-05-07           MRN: [id number]             Visit Date: 07/04/2021  Procedures: Visit Diagnoses:  1. Acute meniscal tear of left knee, subsequent encounter     Large Joint Inj: L knee on 07/04/2021 12:01 PM Indications: diagnostic evaluation, joint swelling and pain Details: 18 G 1.5 in needle, superolateral approach  Arthrogram: No  Medications: 5 mL lidocaine 1 %; 40 mg methylPREDNISolone acetate 40 MG/ML; 4 mL bupivacaine 0.25 % Outcome: tolerated well, no immediate complications Procedure, treatment alternatives, risks and benefits explained, specific risks discussed. Consent was given by the patient. Immediately prior to procedure a time out was called to verify the correct patient, procedure, equipment, support staff and site/side marked as required. Patient was prepped and draped in the usual sterile fashion.    Left knee pain is becoming worse.  MRI scan shows meniscal tear with some high-grade partial-thickness chondral loss.  Aspiration injection performed today and we will recheck her in 4 weeks for clinical recheck and decision for or against consideration of arthroscopic evaluation versus knee replacement.  I do not think really think that she is anywhere close to being ready for knee replacement yet but she is having some pain and swelling.

## 2021-07-05 ENCOUNTER — Telehealth: Payer: Self-pay | Admitting: Orthopedic Surgery

## 2021-07-05 NOTE — Telephone Encounter (Signed)
IC advised ok for this per Dr Dean-order placed and patient advised.

## 2021-07-05 NOTE — Addendum Note (Signed)
Addended byLaurann Montana on: 07/05/2021 10:32 AM   Modules accepted: Orders

## 2021-07-06 ENCOUNTER — Ambulatory Visit: Payer: Federal, State, Local not specified - PPO

## 2021-07-11 ENCOUNTER — Ambulatory Visit: Payer: Federal, State, Local not specified - PPO | Admitting: Orthopedic Surgery

## 2021-07-13 ENCOUNTER — Ambulatory Visit: Payer: Federal, State, Local not specified - PPO | Admitting: Orthopedic Surgery

## 2021-07-17 ENCOUNTER — Ambulatory Visit: Payer: Federal, State, Local not specified - PPO | Admitting: Internal Medicine

## 2021-07-18 ENCOUNTER — Encounter: Payer: Self-pay | Admitting: Physical Therapy

## 2021-07-18 ENCOUNTER — Ambulatory Visit: Payer: Federal, State, Local not specified - PPO | Attending: Orthopedic Surgery | Admitting: Physical Therapy

## 2021-07-18 ENCOUNTER — Other Ambulatory Visit: Payer: Self-pay

## 2021-07-18 DIAGNOSIS — M6281 Muscle weakness (generalized): Secondary | ICD-10-CM | POA: Insufficient documentation

## 2021-07-18 DIAGNOSIS — M25662 Stiffness of left knee, not elsewhere classified: Secondary | ICD-10-CM | POA: Diagnosis not present

## 2021-07-18 DIAGNOSIS — R2689 Other abnormalities of gait and mobility: Secondary | ICD-10-CM | POA: Insufficient documentation

## 2021-07-18 DIAGNOSIS — M25562 Pain in left knee: Secondary | ICD-10-CM | POA: Diagnosis not present

## 2021-07-18 DIAGNOSIS — X58XXXD Exposure to other specified factors, subsequent encounter: Secondary | ICD-10-CM | POA: Insufficient documentation

## 2021-07-18 DIAGNOSIS — S83207D Unspecified tear of unspecified meniscus, current injury, left knee, subsequent encounter: Secondary | ICD-10-CM | POA: Diagnosis not present

## 2021-07-18 NOTE — Therapy (Addendum)
OUTPATIENT PHYSICAL THERAPY LOWER EXTREMITY EVALUATION   Patient Name: Krista Miles MRN: [id number] DOB:[birth year], 66 y.o., female Today's Date: 07/18/2021   PT End of Session - 07/18/21 1013     Visit Number 1    Number of Visits 10    Date for PT Re-Evaluation 09/26/21    Authorization Type [employer] $30 copay    Progress Note Due on Visit 10    PT Start Time 0930    PT Stop Time 1002    PT Time Calculation (min) 32 min    Activity Tolerance Patient tolerated treatment well    Behavior During Therapy WFL for tasks assessed/performed             Past Medical History:  Diagnosis Date   Back pain    Hyperlipidemia    Prediabetes    Vitamin D deficiency    Past Surgical History:  Procedure Laterality Date   CESAREAN SECTION     HAND SURGERY Bilateral 2016   TRIGGER FINGER RELEASE Left 10/04/2017   Dr. Alphonzo Severance   TRIGGER FINGER RELEASE Right 05/06/2018   right ring finger   Patient Active Problem List   Diagnosis Date Noted   Acute left-sided low back pain without sciatica 12/07/2017   Urinary frequency 12/07/2017   Hyperlipidemia 10/28/2017   Abnormal glucose 10/28/2017    REFERRING PROVIDER: Meredith Pel, MD   REFERRING DIAG: S83.207D (ICD-10-CM) - Acute meniscal tear of left knee, subsequent encounter M25.562 (ICD-10-CM) - Left knee pain, unspecified chronicity   THERAPY DIAG:  Acute pain of left knee  Stiffness of left knee, not elsewhere classified  Muscle weakness (generalized)  Other abnormalities of gait and mobility  Rationale for Evaluation and Treatment Rehabilitation  ONSET DATE: chronic  SUBJECTIVE:   SUBJECTIVE STATEMENT: Pt referred to OPPT for Lt knee pain.  She has participated in [place] in May of this year but knee worsened.  She has now had MRI, injection and aspiration.  Pain was relieved for approx 1 week but is back and still present in medial and posterior knee.  MRI shows meniscus tear.  Pt referred for  evaluation for aquatic therapy to see if more tolerated than land-based PT.   PERTINENT HISTORY: N/a  PAIN:  PAIN:  Are you having pain? Yes NPRS scale: 7/10 Pain location: Lt medial and posterior knee Pain orientation: Left and Medial  PAIN TYPE: sharp Pain description: intermittent  Aggravating factors: walking, load bearing Relieving factors: elevate, injection and aspiration helped x 1 week, Celebrex, Gabapentin   PRECAUTIONS: None  WEIGHT BEARING RESTRICTIONS No  FALLS:  Has patient fallen in last 6 months? 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, lifts and loads containers, last day end of this month - is retiring  PLOF: Independent  PATIENT GOALS pain relief, see if water therapy is more tolerated for exercise   OBJECTIVE:   DIAGNOSTIC FINDINGS:  MRI scan shows meniscal tear with some high-grade partial-thickness chondral loss   PATIENT SURVEYS:  FOTO 41%, goal 61%  COGNITION:  Overall cognitive status: Within functional limits for tasks assessed     SENSATION: WFL  EDEMA:  Circumferential: Lt 37cm mid-patellar, Rt 36cm mid-patellar  MUSCLE LENGTH: Hamstrings: Right 65 deg; Left 60 deg   POSTURE:  standing with Lt knee flexed, can straighten when cued but some  pain  PALPATION: Medial joint line, medial hamstring tendon, distal adductors, pes anserine  LOWER EXTREMITY ROM:   Lt knee flexion 114 deg, aches  Rt knee flexion 130 deg  LOWER EXTREMITY MMT: Bil hips 4-/5  Lt knee 3+/5  Rt knee 4/5   FUNCTIONAL TESTS:  5 times sit to stand: 17  GAIT: Distance walked: clinic Assistive device utilized: None Level of assistance: Complete Independence Comments: antalgic, slight decrease in WB through Lt stance phase, leans to Rt    TODAY'S TREATMENT:     [facility] Welcome to [facility] [place]! Here you will find all the information you will need regarding your pool therapy. If you have further questions at any time, please call our office at [phone]. After completing your initial evaluation in the [facility] clinic, you may be eligible to complete a portion of your therapy in the pool. A typical week of therapy will consist of 1-2 typical physical therapy visits at our [place] location and an additional session of therapy in the pool located at the [facility] at [facility]. [address] [place]. The phone number at the pool site is [phone]. Please call this number if you are running late or need to cancel your appointment.  Aquatic therapy will be offered on Wednesday mornings and Friday afternoons. Each session will last approximately 45 minutes. All scheduling and payments for aquatic therapy sessions, including cancelations, will be done through our [place] location.  To be eligible for aquatic therapy, these criteria must be met: You must be able to independently change in the locker room and get to the pool deck. A caregiver can come with you to help if needed. There are benches for a caregiver to sit on next to the pool. No one with an open wound is permitted in the pool.  Handicap parking is available in the front and there is a drop off option for even closer accessibility. Please arrive 15 minutes prior to your appointment to prepare for your pool session. You must sign in at the front desk upon your arrival. Please be sure to attend to any toileting needs prior to entering the pool. [place] rooms for changing are available.  There is direct access to the pool deck from the locker room. You can lock your belongings in a locker or bring them with you poolside. Your therapist will greet you on the pool deck. There may be other swimmers in the pool at the same time but your  session is one-on-one with the therapist.     PATIENT EDUCATION:  Education details: aquatic info, discussed adding icing with elevation, knee sleeve for work shifts, continue HEP from previous PT Person educated: Patient Education method: Theatre stage manager Education comprehension: verbalized understanding   HOME EXERCISE PROGRAM: Aquatic info, continue HEP from prior PT episode with emphasis on hamstring stretching  ASSESSMENT:  CLINICAL IMPRESSION: Patient is a 66 y.o. female who was seen today for physical therapy evaluation and treatment for Lt knee pain.  She has had injection, aspiration, MRI (meniscus tear and chondral changes), and done land-based PT.  Pain relief x 1 week after injection/aspiration but fluid returning (had 1 cm difference in circumferential mid-patellar measure today) and pain with walking and load bearing.  She has gait asymmetry due to avoiding full WB in Lt stance phase.  She is very tender along medial joint line, medial hamstrings, and adductors.  Limited A/ROM of Lt knee 114 deg compared to  130 on Rt knee.  PT Rx was for a trial of aquatic PT to see if more tolerated for exercise.  She is weak in bil hips and Lt>Rt knee.  She works full time and is on feet on concrete floors walking 5-6 miles a night (night shift).  She plans to retire at the end of this month.    OBJECTIVE IMPAIRMENTS Abnormal gait, decreased activity tolerance, difficulty walking, decreased ROM, decreased strength, hypomobility, increased edema, impaired flexibility, and pain.   ACTIVITY LIMITATIONS lifting, bending, standing, squatting, stairs, and locomotion level  PARTICIPATION LIMITATIONS: cleaning, shopping, community activity, and occupation  PERSONAL FACTORS Time since onset of injury/illness/exacerbation are also affecting patient's functional outcome.   REHAB POTENTIAL: Good  CLINICAL DECISION MAKING: Stable/uncomplicated  EVALUATION COMPLEXITY: Low   GOALS: Goals  reviewed with patient? Yes  SHORT TERM GOALS: Target date: 08/15/2021  Pt will be ind with aquatic HEP and be able to perform in community pool through end of summer. Baseline: Goal status: INITIAL  2.  Pt will inquire about a Lt knee sleeve and wear at work to see if this reduces pain while on feet. Baseline:  Goal status: INITIAL  3.  Pt will report at least 20% improvement in Lt medial and posteiror knee pain Baseline:  Goal status: INITIAL    LONG TERM GOALS: Target date: 09/26/2021   Pt will be ind with advanced HEP and understand how to safely progress Baseline:  Goal status: INITIAL  2.  Pt will improve LE strength of bil hips and knees to at least 4+/5 for improved daily task performance. Baseline:  Goal status: INITIAL  3.  Pt will improve FOTO score to at least 61% to demo improved function. Baseline: 41% Goal status:  INITIAL 4.  Improve Lt knee flexion to at least 125 for improved stairs and squatting Baseline: 114 Goal status: INITIAL  5.  Overall reduction in pain by at least 50% with daily tasks requiring bending, lifting and walking Baseline:  Goal status: INITIAL     PLAN: PT FREQUENCY: 2x/week  PT DURATION: 10 weeks (long cert period due to delay with pool appointment availability)  PLANNED INTERVENTIONS: Therapeutic exercises, Therapeutic activity, Neuromuscular re-education, Gait training, Patient/Family education, Aquatic Therapy, Electrical stimulation, Cryotherapy, and Manual therapy  PLAN FOR NEXT SESSION: initiate aquatic program, Pt has pool in community and can do on own in addition to PT sessions   Darina Hartwell, PT 07/18/21 10:15 AM  PHYSICAL THERAPY DISCHARGE SUMMARY  Visits from Start of Care: 1 (eval only)  Current functional level related to goals / functional outcomes: Pt was set up for aquatic PT following intial visit but cancelled due to financial concerns.     Remaining deficits: See initial evaluation above    Education / Equipment: Info on aquatic PT   Patient agrees to discharge. Patient goals were not met. Patient is being discharged due to financial reasons.  Calianna Kim, PT 09/06/21 2:40 PM

## 2021-07-18 NOTE — Patient Instructions (Signed)
     [facility] Welcome to [facility]! Here you will find all the information you will need regarding your pool therapy. If you have further questions at any time, please call our office at [phone]. After completing your initial evaluation in the [facility] clinic, you may be eligible to complete a portion of your therapy in the pool. A typical week of therapy will consist of 1-2 typical physical therapy visits at our [place] location and an additional session of therapy in the pool located at the [facility] at [address]. [address], [place] [address]. The phone number at the pool site is [phone]. Please call this number if you are running late or need to cancel your appointment.  Aquatic therapy will be offered on Wednesday mornings and Friday afternoons. Each session will last approximately 45 minutes. All scheduling and payments for aquatic therapy sessions, including cancelations, will be done through our [place] location.  To be eligible for aquatic therapy, these criteria must be met: You must be able to independently change in the locker room and get to the pool deck. A caregiver can come with you to help if needed. There are benches for a caregiver to sit on next to the pool. No one with an open wound is permitted in the pool.  Handicap parking is available in the front and there is a drop off option for even closer accessibility. Please arrive 15 minutes prior to your appointment to prepare for your pool session. You must sign in at the front desk upon your arrival. Please be sure to attend to any toileting needs prior to entering the pool. Locker rooms for changing are available.  There is direct access to the pool deck from the locker room. You can lock your belongings in a locker or bring them with you poolside. Your therapist will greet you on the pool deck. There may be other swimmers in the pool at the  same time but your session is one-on-one with the therapist.   

## 2021-07-22 ENCOUNTER — Other Ambulatory Visit: Payer: Self-pay | Admitting: Internal Medicine

## 2021-07-24 ENCOUNTER — Telehealth: Payer: Self-pay | Admitting: Orthopedic Surgery

## 2021-07-24 ENCOUNTER — Other Ambulatory Visit: Payer: Self-pay | Admitting: Surgical

## 2021-07-24 MED ORDER — CELECOXIB 100 MG PO CAPS: 100.0000 mg | ORAL_CAPSULE | Freq: Two times a day (BID) | ORAL | 2 refills | Status: DC

## 2021-07-24 NOTE — Telephone Encounter (Signed)
Sent in refill

## 2021-07-24 NOTE — Telephone Encounter (Signed)
Patient called asking for a refill on her celebrex. CB # [phone]

## 2021-08-07 ENCOUNTER — Ambulatory Visit (HOSPITAL_BASED_OUTPATIENT_CLINIC_OR_DEPARTMENT_OTHER): Payer: Federal, State, Local not specified - PPO | Admitting: Physical Therapy

## 2021-08-08 ENCOUNTER — Ambulatory Visit: Payer: Federal, State, Local not specified - PPO | Admitting: Orthopedic Surgery

## 2021-08-10 ENCOUNTER — Ambulatory Visit: Payer: Federal, State, Local not specified - PPO

## 2021-08-13 ENCOUNTER — Ambulatory Visit (HOSPITAL_BASED_OUTPATIENT_CLINIC_OR_DEPARTMENT_OTHER): Payer: Federal, State, Local not specified - PPO | Admitting: Physical Therapy

## 2021-08-16 ENCOUNTER — Ambulatory Visit (HOSPITAL_BASED_OUTPATIENT_CLINIC_OR_DEPARTMENT_OTHER): Payer: Federal, State, Local not specified - PPO | Admitting: Physical Therapy

## 2021-08-20 ENCOUNTER — Ambulatory Visit (HOSPITAL_BASED_OUTPATIENT_CLINIC_OR_DEPARTMENT_OTHER): Payer: Federal, State, Local not specified - PPO | Admitting: Physical Therapy

## 2021-08-22 ENCOUNTER — Ambulatory Visit (HOSPITAL_BASED_OUTPATIENT_CLINIC_OR_DEPARTMENT_OTHER): Payer: Federal, State, Local not specified - PPO | Admitting: Physical Therapy

## 2021-08-27 ENCOUNTER — Ambulatory Visit (HOSPITAL_BASED_OUTPATIENT_CLINIC_OR_DEPARTMENT_OTHER): Payer: Federal, State, Local not specified - PPO | Admitting: Physical Therapy

## 2021-08-29 ENCOUNTER — Ambulatory Visit (HOSPITAL_BASED_OUTPATIENT_CLINIC_OR_DEPARTMENT_OTHER): Payer: Federal, State, Local not specified - PPO | Admitting: Physical Therapy

## 2021-09-03 ENCOUNTER — Ambulatory Visit (HOSPITAL_BASED_OUTPATIENT_CLINIC_OR_DEPARTMENT_OTHER): Payer: Federal, State, Local not specified - PPO | Admitting: Physical Therapy

## 2021-09-05 ENCOUNTER — Ambulatory Visit (HOSPITAL_BASED_OUTPATIENT_CLINIC_OR_DEPARTMENT_OTHER): Payer: Federal, State, Local not specified - PPO | Admitting: Physical Therapy

## 2021-09-07 ENCOUNTER — Ambulatory Visit: Payer: Medicare Other | Admitting: Orthopedic Surgery

## 2021-09-07 ENCOUNTER — Encounter: Payer: Self-pay | Admitting: Orthopedic Surgery

## 2021-09-07 DIAGNOSIS — S83207D Unspecified tear of unspecified meniscus, current injury, left knee, subsequent encounter: Secondary | ICD-10-CM | POA: Diagnosis not present

## 2021-09-07 NOTE — Progress Notes (Signed)
Office Visit Note   Patient: Krista Miles           Date of Birth: 1955-04-20           MRN: [id number] Visit Date: 09/07/2021 Requested by: Glendale Chard, [address] [place],  [place] [address] PCP: Glendale Chard, MD  Subjective: Chief Complaint  Patient presents with   Left Knee - Pain    HPI: Krista Miles is a 66 y.o. female who presents to the office complaining of left knee pain and swelling.  Patient returns following left knee aspiration and injection by Dr. Marlou Sa on 07/04/2021.  Notes mild relief for short period of time from the injection but now pain has come right back.  Still having some swelling on and off based on her activity.  She is taking Celebrex 100 mg twice daily and gabapentin 600 mg twice daily.  She is just retired from the post office.  No prior left knee surgery.  No history of diabetes, blood thinner use, heart disease, smoking.  She has stiffness with sitting for long period of time.  Has occasional catching sensation.  Most of her pain is localized to the medial aspect of the left knee..                ROS: All systems reviewed are negative as they relate to the chief complaint within the history of present illness.  Patient denies fevers or chills.  Assessment & Plan: Visit Diagnoses:  1. Acute meniscal tear of left knee, subsequent encounter     Plan: Patient is a 66 year old female who presents for evaluation of left knee pain and swelling.  She has MRI of the left knee that demonstrates posterior medial meniscal tear with meniscal extrusion.  Discussed the options available to patient as she has some existing chondral wear that is evident on the MRI scan.  After discussion, she would like to try arthroscopy with debridement of the meniscal damage.  Discussed the risks and benefits of the procedure including but not limited to the risk of nerve/blood vessel damage, knee stiffness, incomplete resolution of symptoms, need for repeat  surgery in the future, infection, DVT/PE or other medical complication.  Patient would still like to proceed.  Nature of rehabilitation and timeline also discussed.  Follow-up after procedure.  Follow-Up Instructions: No follow-ups on file.   Orders:  No orders of the defined types were placed in this encounter.  No orders of the defined types were placed in this encounter.     Procedures: No procedures performed   Clinical Data: No additional findings.  Objective: Vital Signs: There were no vitals taken for this visit.  Physical Exam:  Constitutional: Patient appears well-developed HEENT:  Head: Normocephalic Eyes:EOM are normal Neck: Normal range of motion Cardiovascular: Normal rate Pulmonary/chest: Effort normal Neurologic: Patient is alert Skin: Skin is warm Psychiatric: Patient has normal mood and affect  Ortho Exam: Ortho exam demonstrates left knee with trace effusion.  Tenderness over the medial joint line.  No tenderness over the lateral joint line.  No calf tenderness.  Negative Homans' sign.  She is able to perform straight leg raise.  She extends to 0 degrees and flexes to 120 degrees.  No pain with hip range of motion.  No cellulitis or skin changes overlying the left knee.  Specialty Comments:  No specialty comments available.  Imaging: No results found.   PMFS History: Patient Active Problem List   Diagnosis Date Noted  Acute left-sided low back pain without sciatica 12/07/2017   Urinary frequency 12/07/2017   Hyperlipidemia 10/28/2017   Abnormal glucose 10/28/2017   Past Medical History:  Diagnosis Date   Back pain    Hyperlipidemia    Prediabetes    Vitamin D deficiency     Family History  Problem Relation Age of Onset   Dementia Mother    Cancer Father    Leukemia Father    Cancer Maternal Aunt     Past Surgical History:  Procedure Laterality Date   CESAREAN SECTION     HAND SURGERY Bilateral 2016   TRIGGER FINGER RELEASE Left  10/04/2017   Dr. Alphonzo Severance   TRIGGER FINGER RELEASE Right 05/06/2018   right ring finger   Social History   Occupational History   Occupation: [employer]  Tobacco Use   Smoking status: Never   Smokeless tobacco: Never  Vaping Use   Vaping Use: Never used  Substance and Sexual Activity   Alcohol use: No   Drug use: No   Sexual activity: Not on file

## 2021-09-11 ENCOUNTER — Ambulatory Visit: Payer: Federal, State, Local not specified - PPO | Admitting: Physical Therapy

## 2021-09-24 ENCOUNTER — Other Ambulatory Visit: Payer: Self-pay | Admitting: Surgical

## 2021-10-22 ENCOUNTER — Encounter: Payer: Self-pay | Admitting: Internal Medicine

## 2021-10-29 ENCOUNTER — Encounter: Payer: Self-pay | Admitting: Nurse Practitioner

## 2021-10-29 ENCOUNTER — Ambulatory Visit: Payer: Federal, State, Local not specified - PPO | Admitting: Nurse Practitioner

## 2021-10-29 VITALS — BP 120/68 | HR 98 | Temp 98.4°F | Ht 61.0 in | Wt 157.6 lb

## 2021-10-29 DIAGNOSIS — Z01818 Encounter for other preprocedural examination: Secondary | ICD-10-CM | POA: Diagnosis not present

## 2021-10-29 DIAGNOSIS — Z2821 Immunization not carried out because of patient refusal: Secondary | ICD-10-CM

## 2021-10-29 DIAGNOSIS — G8929 Other chronic pain: Secondary | ICD-10-CM | POA: Diagnosis not present

## 2021-10-29 DIAGNOSIS — M25562 Pain in left knee: Secondary | ICD-10-CM | POA: Diagnosis not present

## 2021-10-29 LAB — CMP14+EGFR
ALT: 9 IU/L (ref 0–32)
AST: 15 IU/L (ref 0–40)
Albumin/Globulin Ratio: 1.8 (ref 1.2–2.2)
Albumin: 4.7 g/dL (ref 3.9–4.9)
Alkaline Phosphatase: 80 IU/L (ref 44–121)
BUN/Creatinine Ratio: 16 (ref 12–28)
BUN: 12 mg/dL (ref 8–27)
Bilirubin Total: 0.3 mg/dL (ref 0.0–1.2)
CO2: 24 mmol/L (ref 20–29)
Calcium: 9.6 mg/dL (ref 8.7–10.3)
Chloride: 103 mmol/L (ref 96–106)
Creatinine, Ser: 0.75 mg/dL (ref 0.57–1.00)
Globulin, Total: 2.6 g/dL (ref 1.5–4.5)
Glucose: 102 mg/dL — ABNORMAL HIGH (ref 70–99)
Potassium: 4.1 mmol/L (ref 3.5–5.2)
Sodium: 142 mmol/L (ref 134–144)
Total Protein: 7.3 g/dL (ref 6.0–8.5)
eGFR: 87 mL/min/{1.73_m2} (ref >59)

## 2021-10-29 LAB — CBC
Hematocrit: 37.3 % (ref 34.0–46.6)
Hemoglobin: 12.6 g/dL (ref 11.1–15.9)
MCH: 30 pg (ref 26.6–33.0)
MCHC: 33.8 g/dL (ref 31.5–35.7)
MCV: 89 fL (ref 79–97)
Platelets: 238 10*3/uL (ref 150–450)
RBC: 4.2 x10E6/uL (ref 3.77–5.28)
RDW: 12.1 % (ref 11.7–15.4)
WBC: 5.4 10*3/uL (ref 3.4–10.8)

## 2021-10-29 NOTE — Progress Notes (Signed)
Barnet [place] Martin,acting as a Education administrator for Minette Brine, FNP.,have documented all relevant documentation on the behalf of Minette Brine, FNP,as directed by  Minette Brine, FNP while in the presence of Minette Brine, [place].    Subjective:     Patient ID: Krista Miles , female    DOB: 1955/04/11 , 66 y.o.   MRN: [id number]   Chief Complaint  Patient presents with   Pre-op Exam    HPI  Patient presents today for a pre-op exam. Patient states she is getting surgery in [place] by Dr. Elvia Collum on her left knee (left knee orthroscopy with partial medial meniscectomy, she does not have a date set. She denies any symptoms of shortness of breath or chest pain. She has recently retired from the post office. Injury at work in March 2023, this is a workers comp injury   BP Readings from Last 3 Encounters: 10/29/21 : 120/68 03/19/21 : 116/72 02/07/21 : 110/66       Past Medical History:  Diagnosis Date   Back pain    Hyperlipidemia    Prediabetes    Vitamin D deficiency      Family History  Problem Relation Age of Onset   Dementia Mother    Cancer Father    Leukemia Father    Cancer Maternal Aunt      Current Outpatient Medications:    celecoxib (CELEBREX) 100 MG capsule, TAKE 1 CAPSULE BY MOUTH TWICE A DAY, Disp: 60 capsule, Rfl: 2   gabapentin (NEURONTIN) 300 MG capsule, Take 1 capsule (300 mg total) by mouth 2 (two) times daily., Disp: 180 capsule, Rfl: 1   Vitamin D, Ergocalciferol, (DRISDOL) 1.25 MG (50000 UNIT) CAPS capsule, TAKE 1 CAPSULE EVERY WEEK ON TUESDAYS AND THURSDAYS, Disp: 24 capsule, Rfl: 0   Acetaminophen-Codeine 300-30 MG tablet, Take 1 tablet by mouth every 6 (six) hours as needed. (Patient not taking: Reported on 05/10/2021), Disp: , Rfl:    Semaglutide-Weight Management (WEGOVY) 0.5 MG/0.5ML SOAJ, Inject 0.5 mg into the skin once a week. (Patient not taking: Reported on 10/29/2021), Disp: 2 mL, Rfl: 1   No Known Allergies   Review of Systems  Constitutional:  Negative.   HENT: Negative.    Eyes: Negative.   Respiratory: Negative.  Negative for shortness of breath.   Cardiovascular:  Negative for chest pain, palpitations and leg swelling.  Gastrointestinal: Negative.   Psychiatric/Behavioral: Negative.       Today's Vitals   10/29/21 1022  BP: 120/68  Pulse: 98  Temp: 98.4 F (36.9 C)  TempSrc: Oral  Weight: 157 lb 9.6 oz (71.5 kg)  Height: _0  (1.549 m)  PainSc: 7   PainLoc: Knee   Body mass index is 29.78 kg/m.  Wt Readings from Last 3 Encounters:  10/29/21 157 lb 9.6 oz (71.5 kg)  03/19/21 164 lb 12.8 oz (74.8 kg)  02/07/21 170 lb 6.4 oz (77.3 kg)    Objective:  Physical Exam Vitals reviewed.  Constitutional:      General: She is not in acute distress.    Appearance: Normal appearance.  Cardiovascular:     Rate and Rhythm: Normal rate and regular rhythm.     Pulses: Normal pulses.     Heart sounds: Normal heart sounds. No murmur heard. Pulmonary:     Effort: Pulmonary effort is normal. No respiratory distress.     Breath sounds: Normal breath sounds. No wheezing.  Neurological:     Mental Status: She is alert.  Assessment And Plan:     1. Chronic pain of left knee Comments: She is preparing for knee surgery  2. Pre-op evaluation Comments: Will check necessary labs. EKG done with NSR HR 65. Pending lab results she is cleared for surgery with low risk - CMP14+EGFR - CBC no Diff - EKG 12-Lead - Protime-INR  3. Immunization declined Patient declined influenza vaccination at this time. Patient is aware that influenza vaccine prevents illness in 70% of healthy people, and reduces hospitalizations to 30-70% in elderly. This vaccine is recommended annually. Education has been provided regarding the importance of this vaccine but patient still declined. Advised may receive this vaccine at local pharmacy or Health Dept.or vaccine clinic. Aware to provide a copy of the vaccination record if obtained from local  pharmacy or Health Dept.  Pt is willing to accept risk associated with refusing vaccination.    Patient was given opportunity to ask questions. Patient verbalized understanding of the plan and was able to repeat key elements of the plan. All questions were answered to their satisfaction.  Minette Brine, FNP   I, Minette Brine, FNP, have reviewed all documentation for this visit. The documentation on 10/29/21 for the exam, diagnosis, procedures, and orders are all accurate and complete.   IF YOU HAVE BEEN REFERRED TO A SPECIALIST, IT MAY TAKE 1-2 WEEKS TO SCHEDULE/PROCESS THE REFERRAL. IF YOU HAVE NOT HEARD FROM US/SPECIALIST IN TWO WEEKS, PLEASE GIVE [place] A CALL AT [phone].   THE PATIENT IS ENCOURAGED TO PRACTICE SOCIAL DISTANCING DUE TO THE COVID-19 PANDEMIC.

## 2021-10-29 NOTE — Patient Instructions (Signed)
Knee Arthroscopy Knee arthroscopy is a surgery to examine the inside of the knee joint and repair any damage to cartilage, surfaces, and other soft tissues around the joint. You may have this surgery if nonsurgical treatment has not relieved your symptoms. Knee arthroscopy may be used to: Repair a torn ligament or other torn tissues. Ligaments are tissues that connect bones to each other. Remove bone fragments. Remove a fluid-filled sac (cyst). Treat kneecap (patella)problems. Treat septic knee. This is an advanced infection in the knee. Arthroscopic surgery is done using a thin tube that has a light and camera on the end of it (arthroscope). The arthroscope is placed through a small incision, and the camera sends images to a screen in the operating room. The images are used to help perform the surgery. Tell a health care provider about: Any allergies you have. All medicines you are taking, including vitamins, herbs, eye drops, creams, and over-the-counter medicines. Any problems you or family members have had with anesthetic medicines. Any blood disorders you have. Any surgeries you have had. Any medical conditions you have. Whether you are pregnant or may be pregnant. What are the risks? Generally, this is a safe procedure. However, problems may occur, including: Infection. Bleeding. Allergic reactions to medicines. Damage to blood vessels, nerves, or tissues in the knee. A blood clot that forms in the leg and travels to the lung (pulmonary embolism). Failure of the surgery to relieve symptoms. Knee stiffness. What happens before the procedure? Staying hydrated Follow instructions from your health care provider about hydration, which may include: Up to 2 hours before the procedure - you may continue to drink clear liquids, such as water, clear fruit juice, black coffee, and plain tea.  Eating and drinking restrictions Follow instructions from your health care provider about eating  and drinking, which may include: 8 hours before the procedure - stop eating heavy meals or foods, such as meat, fried foods, or fatty foods. 6 hours before the procedure - stop eating light meals or foods, such as toast or cereal. 6 hours before the procedure - stop drinking milk or drinks that contain milk. 2 hours before the procedure - stop drinking clear liquids. Medicines Ask your health care provider about: Changing or stopping your regular medicines. This is especially important if you are taking diabetes medicines or blood thinners. Taking medicines such as aspirin and ibuprofen. These medicines can thin your blood. Do not take these medicines unless your health care provider tells you to take them. Taking over-the-counter medicines, vitamins, herbs, and supplements. General instructions You may have a physical exam and tests, such as an X-ray, CT scan, or MRI. Do not drink alcohol unless your health care provider says that you can. Do not use any products that contain nicotine or tobacco for at least 4 weeks before the procedure. These products include cigarettes, e-cigarettes, and chewing tobacco. If you need help quitting, ask your health care provider. Plan to have a responsible adult take you home from the hospital or clinic. If you will be going home right after the procedure, plan to have a responsible adult care for you for the time you are told. This is important. Ask your health care provider: How your surgery site will be marked. What steps will be taken to help prevent infection. These steps may include: Removing hair at the surgery site. Washing skin with a germ-killing soap. Taking antibiotic medicine. What happens during the procedure?  An IV will be inserted into one of your  veins. You will be given one or more of the following: A medicine to help you relax (sedative). A medicine to numb the knee area (local anesthetic). A medicine to make you fall asleep (general  anesthetic). A medicine that is injected into an area of your body to numb everything below the injection site (regional anesthetic). This may be injected into your groin or thigh. A cuff may be placed around your upper leg to slow blood flow to your lower leg during the procedure. Several small incisions will be made around your knee. Your knee joint will be rinsed (flushed) and filled with sterile saline. This is a germ-free solution made of salt and water. This expands the area to help your surgeon see your joint more clearly. An arthroscope will be passed through one of your incisions, into your knee joint. Other surgical instruments will be passed through the other incisions. Then, your surgeon will examine and repair your knee as needed. The sterile saline will be drained from your knee, and the cuff will be removed from your upper leg. Your incisions will be closed with adhesive strips or stitches, also called sutures, and covered with a bandage (dressing). The procedure may vary among health care providers and hospitals. What happens after the procedure?  Your blood pressure, heart rate, breathing rate, and blood oxygen level will be monitored until you leave the hospital or clinic. You will be given pain medicine as needed. You may be given medicine to lower your risk of blood clots. You may have to wear compression stockings. These stockings help to prevent blood clots and reduce swelling in your legs. You may be given a knee brace or immobilizer. Do not drive or use machinery until your health care provider approves. Summary Knee arthroscopy is a surgery to examine or repair the inside of your knee joint. Before the procedure, follow instructions from your health care provider about eating and drinking. Plan to have a responsible adult take you home from the hospital or clinic. This information is not intended to replace advice given to you by your health care provider. Make sure you  discuss any questions you have with your health care provider. Document Revised: 04/26/2019 Document Reviewed: 04/26/2019 Elsevier Patient Education  [place].

## 2021-10-30 LAB — PROTIME-INR
INR: 0.9 (ref 0.9–1.2)
Prothrombin Time: 10.3 s (ref 9.1–12.0)

## 2021-11-21 HISTORY — PX: KNEE ARTHROSCOPY: SUR90

## 2022-02-21 ENCOUNTER — Other Ambulatory Visit: Payer: Self-pay | Admitting: Internal Medicine

## 2022-03-19 ENCOUNTER — Ambulatory Visit: Payer: Federal, State, Local not specified - PPO | Admitting: Internal Medicine

## 2022-03-19 ENCOUNTER — Other Ambulatory Visit: Payer: Self-pay

## 2022-03-19 ENCOUNTER — Encounter: Payer: Self-pay | Admitting: Internal Medicine

## 2022-03-19 VITALS — BP 124/82 | HR 81 | Temp 98.2°F | Ht 61.0 in | Wt 160.4 lb

## 2022-03-19 DIAGNOSIS — G5603 Carpal tunnel syndrome, bilateral upper limbs: Secondary | ICD-10-CM

## 2022-03-19 DIAGNOSIS — Z6830 Body mass index (BMI) 30.0-30.9, adult: Secondary | ICD-10-CM

## 2022-03-19 DIAGNOSIS — M25562 Pain in left knee: Secondary | ICD-10-CM

## 2022-03-19 DIAGNOSIS — E559 Vitamin D deficiency, unspecified: Secondary | ICD-10-CM

## 2022-03-19 DIAGNOSIS — E6609 Other obesity due to excess calories: Secondary | ICD-10-CM

## 2022-03-19 DIAGNOSIS — G8929 Other chronic pain: Secondary | ICD-10-CM

## 2022-03-19 DIAGNOSIS — E7800 Pure hypercholesterolemia, unspecified: Secondary | ICD-10-CM

## 2022-03-19 MED ORDER — VITAMIN D (ERGOCALCIFEROL) 1.25 MG (50000 UNIT) PO CAPS: ORAL_CAPSULE | ORAL | 0 refills | Status: DC

## 2022-03-19 MED ORDER — CELECOXIB 100 MG PO CAPS: 100.0000 mg | ORAL_CAPSULE | Freq: Two times a day (BID) | ORAL | 2 refills | Status: DC

## 2022-03-19 MED ORDER — GABAPENTIN 300 MG PO CAPS: 300.0000 mg | ORAL_CAPSULE | Freq: Two times a day (BID) | ORAL | 1 refills | Status: DC

## 2022-03-19 NOTE — Progress Notes (Unsigned)
I,Victoria T Hamilton,acting as a scribe for Maximino Greenland, MD.,have documented all relevant documentation on the behalf of Maximino Greenland, MD,as directed by  Maximino Greenland, MD while in the presence of Maximino Greenland, MD.    Subjective:     Patient ID: Krista Miles , female    DOB: 1955/02/07 , 67 y.o.   MRN: [id number]   Chief Complaint  Patient presents with   Hyperlipidemia    HPI  The patient is here today for a follow-up on her weight and her cholesterol. She was prescribed pravastatin to treat hyperlipidemia. Admits that she is not taking as prescribed.  Unfortunately, her insurance does not cover [phone]. She has been unable to fill the rx once the savings card ran out.   She would like a refill for Celebrex.      Past Medical History:  Diagnosis Date   Back pain    Hyperlipidemia    Prediabetes    Vitamin D deficiency      Family History  Problem Relation Age of Onset   Dementia Mother    Cancer Father    Leukemia Father    Cancer Maternal Aunt      Current Outpatient Medications:    Acetaminophen-Codeine 300-30 MG tablet, Take 1 tablet by mouth every 6 (six) hours as needed. (Patient not taking: Reported on 05/10/2021), Disp: , Rfl:    celecoxib (CELEBREX) 100 MG capsule, Take 1 capsule (100 mg total) by mouth 2 (two) times daily., Disp: 60 capsule, Rfl: 2   gabapentin (NEURONTIN) 300 MG capsule, Take 1 capsule (300 mg total) by mouth 2 (two) times daily., Disp: 180 capsule, Rfl: 1   Vitamin D, Ergocalciferol, (DRISDOL) 1.25 MG (50000 UNIT) CAPS capsule, TAKE 1 CAPSULE EVERY WEEK ON TUESDAYS AND THURSDAYS., Disp: 24 capsule, Rfl: 0   No Known Allergies   Review of Systems  Constitutional: Negative.   Respiratory: Negative.    Cardiovascular: Negative.   Gastrointestinal: Negative.   Musculoskeletal:  Positive for arthralgias.       She c/o L knee pain. Recently had arthroscopic surgery on left knee. Had surgery in [place]. Wants to have PT  locally.   Neurological:  Positive for numbness.       She c/o b/l hand numbness. States she has dx of CTS. She had NCS w/ Ortho in [place]. Wants to have surgical evaluation  Psychiatric/Behavioral: Negative.       Today's Vitals   03/19/22 0839  BP: 124/82  Pulse: 81  Temp: 98.2 F (36.8 C)  SpO2: 98%  Weight: 160 lb 6.4 oz (72.8 kg)  Height: '5\' 1"'$  (1.549 m)   Body mass index is 30.31 kg/m.  Wt Readings from Last 3 Encounters:  03/19/22 160 lb 6.4 oz (72.8 kg)  10/29/21 157 lb 9.6 oz (71.5 kg)  03/19/21 164 lb 12.8 oz (74.8 kg)    Objective:  Physical Exam Vitals and nursing note reviewed.  Constitutional:      Appearance: Normal appearance.  HENT:     Head: Normocephalic and atraumatic.     Nose:     Comments: Masked     Mouth/Throat:     Comments: Masked  Eyes:     Extraocular Movements: Extraocular movements intact.  Cardiovascular:     Rate and Rhythm: Normal rate and regular rhythm.     Heart sounds: Normal heart sounds.  Pulmonary:     Effort: Pulmonary effort is normal.     Breath sounds: Normal  breath sounds.  Musculoskeletal:     Cervical back: Normal range of motion.  Skin:    General: Skin is warm.  Neurological:     General: No focal deficit present.     Mental Status: She is alert.  Psychiatric:        Mood and Affect: Mood normal.        Behavior: Behavior normal.     Assessment And Plan:     1. Pure hypercholesterolemia Comments: Chronic, I will check fasting lipid panel.  She admits to non-compliance with meds. I will assess ASCVD risk once resluts are available. - CMP14+EGFR - CBC - Lipid panel - TSH  2. Bilateral carpal tunnel syndrome Comments: I will refer her to Hand Surgeon. I will request her nerve conduction study results from Ortho in [place]. - Ambulatory referral to Hand Surgery  3. Chronic pain of left knee Comments: I will send her to rehab as requested. Ortho requested 2x/week x 4 weeks for rehab s/p L arthroscopic  surgery. - Ambulatory referral to Physical Therapy  4. Vitamin D deficiency disease Comments: I will check a vitamin D level and supplement as needed. - CMP14+EGFR - Vitamin D (25 hydroxy)  5. Class 1 obesity due to excess calories with serious comorbidity and body mass index (BMI) of 30.0 to 30.9 in adult Comments: She is encouraged to aim for at least 150 minutes of exercise per week.  Patient was given opportunity to ask questions. Patient verbalized understanding of the plan and was able to repeat key elements of the plan. All questions were answered to their satisfaction.   I, Maximino Greenland, MD, have reviewed all documentation for this visit. The documentation on 03/19/22 for the exam, diagnosis, procedures, and orders are all accurate and complete.   IF YOU HAVE BEEN REFERRED TO A SPECIALIST, IT MAY TAKE 1-2 WEEKS TO SCHEDULE/PROCESS THE REFERRAL. IF YOU HAVE NOT HEARD FROM US/SPECIALIST IN TWO WEEKS, PLEASE GIVE [place] A CALL AT [phone] X [phone].   THE PATIENT IS ENCOURAGED TO PRACTICE SOCIAL DISTANCING DUE TO THE COVID-19 PANDEMIC.

## 2022-03-19 NOTE — Patient Instructions (Addendum)
The 10-year ASCVD risk score (Arnett DK, et al., 2019) is: 7.2%   Values used to calculate the score:     Age: 67 years     Sex: Female     Is Non-Hispanic African American: No     Diabetic: No     Tobacco smoker: No     Systolic Blood Pressure: [id number] mmHg     Is BP treated: No     HDL Cholesterol: 63 mg/dL     Total Cholesterol: 218 mg/dL  Coronary Calcium Scan A coronary calcium scan is an imaging test used to look for deposits of plaque in the inner lining of the blood vessels of the heart (coronary arteries). Plaque is made up of calcium, protein, and fatty substances. These deposits of plaque can partly clog and narrow the coronary arteries without producing any symptoms or warning signs. This puts a person at risk for a heart attack. A coronary calcium scan is performed using a computed tomography (CT) scanner machine without using a dye (contrast). This test is recommended for people who are at moderate risk for heart disease. The test can find plaque deposits before symptoms develop. Tell a health care provider about: Any allergies you have. All medicines you are taking, including vitamins, herbs, eye drops, creams, and over-the-counter medicines. Any problems you or family members have had with anesthetic medicines. Any bleeding problems you have. Any surgeries you have had. Any medical conditions you have. Whether you are pregnant or may be pregnant. What are the risks? Generally, this is a safe procedure. However, problems may occur, including: Harm to a pregnant woman and her unborn baby. This test involves the use of radiation. Radiation exposure can be dangerous to a pregnant woman and her unborn baby. If you are pregnant or think you may be pregnant, you should not have this procedure done. A slight increase in the risk of cancer. This is because of the radiation involved in the test. The amount of radiation from one test is similar to the amount of radiation you are naturally  exposed to over one year. What happens before the procedure? Ask your health care provider for any specific instructions on how to prepare for this procedure. You may be asked to avoid products that contain caffeine, tobacco, or nicotine for 4 hours before the procedure. What happens during the procedure?  You will undress and remove any jewelry from your neck or chest. You may need to remove hearing aides and dentures. Women may need to remove their bras. You will put on a hospital gown. Sticky electrodes will be placed on your chest. The electrodes will be connected to an electrocardiogram (ECG) machine to record a tracing of the electrical activity of your heart. You will lie down on your back on a curved bed that is attached to the [place]. You may be given medicine to slow down your heart rate so that clear pictures can be created. You will be moved into the CT scanner, and the CT scanner will take pictures of your heart. During this time, you will be asked to lie still and hold your breath for 10-20 seconds at a time while each picture of your heart is being taken. The procedure may vary among health care providers and hospitals. What can I expect after the procedure? You can return to your normal activities. It is up to you to get the results of your procedure. Ask your health care provider, or the department that is doing  the procedure, when your results will be ready. Summary A coronary calcium scan is an imaging test used to look for deposits of plaque in the inner lining of the blood vessels of the heart. Plaque is made up of calcium, protein, and fatty substances. A coronary calcium scan is performed using a CT scanner machine without contrast. Generally, this is a safe procedure. Tell your health care provider if you are pregnant or may be pregnant. Ask your health care provider for any specific instructions on how to prepare for this procedure. You can return to your normal  activities after the scan is done. This information is not intended to replace advice given to you by your health care provider. Make sure you discuss any questions you have with your health care provider. Document Revised: 12/03/2020 Document Reviewed: 12/03/2020 Elsevier Patient Education  [place].  High Cholesterol  High cholesterol is a condition in which the blood has high levels of a white, waxy substance similar to fat (cholesterol). The liver makes all the cholesterol that the body needs. The human body needs small amounts of cholesterol to help build cells. A person gets extra or excess cholesterol from the food that he or she eats. The blood carries cholesterol from the liver to the rest of the body. If you have high cholesterol, deposits (plaques) may build up on the walls of your arteries. Arteries are the blood vessels that carry blood away from your heart. These plaques make the arteries narrow and stiff. Cholesterol plaques increase your risk for heart attack and stroke. Work with your health care provider to keep your cholesterol levels in a healthy range. What increases the risk? The following factors may make you more likely to develop this condition: Eating foods that are high in animal fat (saturated fat) or cholesterol. Being overweight. Not getting enough exercise. A family history of high cholesterol (familial hypercholesterolemia). Use of tobacco products. Having diabetes. What are the signs or symptoms? In most cases, high cholesterol does not usually cause any symptoms. In severe cases, very high cholesterol levels can cause: Fatty bumps under the skin (xanthomas). A white or gray ring around the black center (pupil) of the eye. How is this diagnosed? This condition may be diagnosed based on the results of a blood test. If you are older than 68 years of age, your health care provider may check your cholesterol levels every 4-6 years. You may be checked  more often if you have high cholesterol or other risk factors for heart disease. The blood test for cholesterol measures: "Bad" cholesterol, or LDL cholesterol. This is the main type of cholesterol that causes heart disease. The desired level is less than 100 mg/dL (2.59 mmol/L). "Good" cholesterol, or HDL cholesterol. HDL helps protect against heart disease by cleaning the arteries and carrying the LDL to the liver for processing. The desired level for HDL is 60 mg/dL (1.55 mmol/L) or higher. Triglycerides. These are fats that your body can store or burn for energy. The desired level is less than 150 mg/dL (1.69 mmol/L). Total cholesterol. This measures the total amount of cholesterol in your blood and includes LDL, HDL, and triglycerides. The desired level is less than 200 mg/dL (5.17 mmol/L). How is this treated? Treatment for high cholesterol starts with lifestyle changes, such as diet and exercise. Diet changes. You may be asked to eat foods that have more fiber and less saturated fats or added sugar. Lifestyle changes. These may include regular exercise, maintaining a healthy  weight, and quitting use of tobacco products. Medicines. These are given when diet and lifestyle changes have not worked. You may be prescribed a statin medicine to help lower your cholesterol levels. Follow these instructions at home: Eating and drinking  Eat a healthy, balanced diet. This diet includes: Daily servings of a variety of fresh, frozen, or canned fruits and vegetables. Daily servings of whole grain foods that are rich in fiber. Foods that are low in saturated fats and trans fats. These include poultry and fish without skin, lean cuts of meat, and low-fat dairy products. A variety of fish, especially oily fish that contain omega-3 fatty acids. Aim to eat fish at least 2 times a week. Avoid foods and drinks that have added sugar. Use healthy cooking methods, such as roasting, grilling, broiling, baking,  poaching, steaming, and stir-frying. Do not fry your food except for stir-frying. If you drink alcohol: Limit how much you have to: 0-1 drink a day for women who are not pregnant. 0-2 drinks a day for men. Know how much alcohol is in a drink. In the [place] one drink equals one 12 oz bottle of beer (355 mL), one 5 oz glass of wine (148 mL), or one 1 oz glass of hard liquor (44 mL). Lifestyle  Get regular exercise. Aim to exercise for a total of 150 minutes a week. Increase your activity level by doing activities such as gardening, walking, and taking the stairs. Do not use any products that contain nicotine or tobacco. These products include cigarettes, chewing tobacco, and vaping devices, such as e-cigarettes. If you need help quitting, ask your health care provider. General instructions Take over-the-counter and prescription medicines only as told by your health care provider. Keep all follow-up visits. This is important. Where to find more information American Heart Association: www.heart.org National Heart, Lung, and Blood Institute: https://wilson-[place].com/ Contact a health care provider if: You have trouble achieving or maintaining a healthy diet or weight. You are starting an exercise program. You are unable to stop smoking. Get help right away if: You have chest pain. You have trouble breathing. You have discomfort or pain in your jaw, neck, back, shoulder, or arm. You have any symptoms of a stroke. "BE FAST" is an easy way to remember the main warning signs of a stroke: B - Balance. Signs are dizziness, sudden trouble walking, or loss of balance. E - Eyes. Signs are trouble seeing or a sudden change in vision. F - Face. Signs are sudden weakness or numbness of the face, or the face or eyelid drooping on one side. A - Arms. Signs are weakness or numbness in an arm. This happens suddenly and usually on one side of the body. S - Speech. Signs are sudden trouble speaking, slurred speech, or  trouble understanding what people say. T - Time. Time to call emergency services. Write down what time symptoms started. You have other signs of a stroke, such as: A sudden, severe headache with no known cause. Nausea or vomiting. Seizure. These symptoms may represent a serious problem that is an emergency. Do not wait to see if the symptoms will go away. Get medical help right away. Call your local emergency services (911 in the [place] Do not drive yourself to the hospital. Summary Cholesterol plaques increase your risk for heart attack and stroke. Work with your health care provider to keep your cholesterol levels in a healthy range. Eat a healthy, balanced diet, get regular exercise, and maintain a healthy weight. Do not  use any products that contain nicotine or tobacco. These products include cigarettes, chewing tobacco, and vaping devices, such as e-cigarettes. Get help right away if you have any symptoms of a stroke. This information is not intended to replace advice given to you by your health care provider. Make sure you discuss any questions you have with your health care provider. Document Revised: 07/27/2021 Document Reviewed: 02/28/2020 Elsevier Patient Education  [place].

## 2022-03-20 LAB — CMP14+EGFR
ALT: 9 IU/L (ref 0–32)
AST: 11 IU/L (ref 0–40)
Albumin/Globulin Ratio: 1.8 (ref 1.2–2.2)
Albumin: 4.6 g/dL (ref 3.9–4.9)
Alkaline Phosphatase: 95 IU/L (ref 44–121)
BUN/Creatinine Ratio: 14 (ref 12–28)
BUN: 11 mg/dL (ref 8–27)
Bilirubin Total: 0.3 mg/dL (ref 0.0–1.2)
CO2: 25 mmol/L (ref 20–29)
Calcium: 9.5 mg/dL (ref 8.7–10.3)
Chloride: 101 mmol/L (ref 96–106)
Creatinine, Ser: 0.77 mg/dL (ref 0.57–1.00)
Globulin, Total: 2.6 g/dL (ref 1.5–4.5)
Glucose: 107 mg/dL — ABNORMAL HIGH (ref 70–99)
Potassium: 5.3 mmol/L — ABNORMAL HIGH (ref 3.5–5.2)
Sodium: 140 mmol/L (ref 134–144)
Total Protein: 7.2 g/dL (ref 6.0–8.5)
eGFR: 84 mL/min/{1.73_m2} (ref >59)

## 2022-03-20 LAB — CBC
Hematocrit: 39.7 % (ref 34.0–46.6)
Hemoglobin: 13.2 g/dL (ref 11.1–15.9)
MCH: 30.6 pg (ref 26.6–33.0)
MCHC: 33.2 g/dL (ref 31.5–35.7)
MCV: 92 fL (ref 79–97)
Platelets: 277 10*3/uL (ref 150–450)
RBC: 4.31 x10E6/uL (ref 3.77–5.28)
RDW: 12.3 % (ref 11.7–15.4)
WBC: 5.1 10*3/uL (ref 3.4–10.8)

## 2022-03-20 LAB — LIPID PANEL
Chol/HDL Ratio: 3.8 ratio (ref 0.0–4.4)
Cholesterol, Total: 279 mg/dL — ABNORMAL HIGH (ref 100–199)
HDL: 73 mg/dL (ref >39)
LDL Chol Calc (NIH): 191 mg/dL — ABNORMAL HIGH (ref 0–99)
Triglycerides: 91 mg/dL (ref 0–149)
VLDL Cholesterol Cal: 15 mg/dL (ref 5–40)

## 2022-03-20 LAB — TSH: TSH: 1.46 u[IU]/mL (ref 0.450–4.500)

## 2022-03-20 LAB — VITAMIN D 25 HYDROXY (VIT D DEFICIENCY, FRACTURES): Vit D, 25-Hydroxy: 70.6 ng/mL (ref 30.0–100.0)

## 2022-03-21 ENCOUNTER — Encounter: Payer: Self-pay | Admitting: Internal Medicine

## 2022-05-17 ENCOUNTER — Emergency Department (HOSPITAL_COMMUNITY)
Admission: EM | Admit: 2022-05-17 | Discharge: 2022-05-17 | Disposition: A | Discharge status: Home / Self Care | Payer: No Typology Code available for payment source | Attending: Emergency Medicine | Admitting: Emergency Medicine

## 2022-05-17 ENCOUNTER — Emergency Department (HOSPITAL_COMMUNITY): Payer: No Typology Code available for payment source

## 2022-05-17 ENCOUNTER — Other Ambulatory Visit: Payer: Self-pay

## 2022-05-17 DIAGNOSIS — Y92481 Parking lot as the place of occurrence of the external cause: Secondary | ICD-10-CM | POA: Diagnosis not present

## 2022-05-17 DIAGNOSIS — R519 Headache, unspecified: Secondary | ICD-10-CM | POA: Diagnosis present

## 2022-05-17 MED ORDER — IBUPROFEN 200 MG PO TABS
600.0000 mg | ORAL_TABLET | Freq: Once | ORAL | Status: AC
  Administered 2022-05-17: 600 mg via ORAL
  Filled 2022-05-17: qty 3
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
## 2022-05-27 ENCOUNTER — Ambulatory Visit: Payer: Federal, State, Local not specified - PPO | Admitting: Nurse Practitioner

## 2022-05-30 ENCOUNTER — Ambulatory Visit (INDEPENDENT_AMBULATORY_CARE_PROVIDER_SITE_OTHER): Payer: Medicare Other | Admitting: Internal Medicine

## 2022-05-30 ENCOUNTER — Encounter: Payer: Self-pay | Admitting: Internal Medicine

## 2022-05-30 VITALS — BP 130/82 | HR 74 | Temp 98.3°F | Ht 61.0 in | Wt 158.8 lb

## 2022-05-30 DIAGNOSIS — E6609 Other obesity due to excess calories: Secondary | ICD-10-CM | POA: Diagnosis not present

## 2022-05-30 DIAGNOSIS — E7800 Pure hypercholesterolemia, unspecified: Secondary | ICD-10-CM

## 2022-05-30 DIAGNOSIS — S161XXD Strain of muscle, fascia and tendon at neck level, subsequent encounter: Secondary | ICD-10-CM

## 2022-05-30 DIAGNOSIS — R519 Headache, unspecified: Secondary | ICD-10-CM

## 2022-05-30 DIAGNOSIS — S161XXA Strain of muscle, fascia and tendon at neck level, initial encounter: Secondary | ICD-10-CM

## 2022-05-30 DIAGNOSIS — Z6830 Body mass index (BMI) 30.0-30.9, adult: Secondary | ICD-10-CM

## 2022-05-30 MED ORDER — CYCLOBENZAPRINE HCL 5 MG PO TABS: ORAL_TABLET | ORAL | 0 refills | Status: AC

## 2022-05-30 NOTE — Patient Instructions (Signed)
Cholesterol Content in Foods Cholesterol is a waxy, fat-like substance that helps to carry fat in the blood. The body needs cholesterol in small amounts, but too much cholesterol can cause damage to the arteries and heart. What foods have cholesterol?  Cholesterol is found in animal-based foods, such as meat, seafood, and dairy. Generally, low-fat dairy and lean meats have less cholesterol than full-fat dairy and fatty meats. The milligrams of cholesterol per serving (mg per serving) of common cholesterol-containing foods are listed below. Meats and other proteins Egg -- one large whole egg has 186 mg. Veal shank -- 4 oz (113 g) has 141 mg. Lean ground [place] (93% lean) -- 4 oz (113 g) has 118 mg. Fat-trimmed lamb loin -- 4 oz (113 g) has 106 mg. Lean ground beef (90% lean) -- 4 oz (113 g) has 100 mg. Lobster -- 3.5 oz (99 g) has 90 mg. Pork loin chops -- 4 oz (113 g) has 86 mg. Canned salmon -- 3.5 oz (99 g) has 83 mg. Fat-trimmed beef top loin -- 4 oz (113 g) has 78 mg. Frankfurter -- 1 frank (3.5 oz or 99 g) has 77 mg. Crab -- 3.5 oz (99 g) has 71 mg. Roasted chicken without skin, white meat -- 4 oz (113 g) has 66 mg. Light bologna -- 2 oz (57 g) has 45 mg. Deli-cut [place] -- 2 oz (57 g) has 31 mg. Canned tuna -- 3.5 oz (99 g) has 31 mg. Tomasa Blase -- 1 oz (28 g) has 29 mg. Oysters and mussels (raw) -- 3.5 oz (99 g) has 25 mg. Mackerel -- 1 oz (28 g) has 22 mg. Trout -- 1 oz (28 g) has 20 mg. Pork sausage -- 1 link (1 oz or 28 g) has 17 mg. Salmon -- 1 oz (28 g) has 16 mg. Tilapia -- 1 oz (28 g) has 14 mg. Dairy Soft-serve ice cream --  cup (4 oz or 86 g) has 103 mg. Whole-milk yogurt -- 1 cup (8 oz or 245 g) has 29 mg. Cheddar cheese -- 1 oz (28 g) has 28 mg. American cheese -- 1 oz (28 g) has 28 mg. Whole milk -- 1 cup (8 oz or 250 mL) has 23 mg. 2% milk -- 1 cup (8 oz or 250 mL) has 18 mg. Cream cheese -- 1 tablespoon (Tbsp) (14.5 g) has 15 mg. Cottage cheese --  cup (4 oz or  113 g) has 14 mg. Low-fat (1%) milk -- 1 cup (8 oz or 250 mL) has 10 mg. Sour cream -- 1 Tbsp (12 g) has 8.5 mg. Low-fat yogurt -- 1 cup (8 oz or 245 g) has 8 mg. Nonfat Greek yogurt -- 1 cup (8 oz or 228 g) has 7 mg. Half-and-half cream -- 1 Tbsp (15 mL) has 5 mg. Fats and oils Cod liver oil -- 1 tablespoon (Tbsp) (13.6 g) has 82 mg. Butter -- 1 Tbsp (14 g) has 15 mg. Lard -- 1 Tbsp (12.8 g) has 14 mg. Bacon grease -- 1 Tbsp (12.9 g) has 14 mg. Mayonnaise -- 1 Tbsp (13.8 g) has 5-10 mg. Margarine -- 1 Tbsp (14 g) has 3-10 mg. The items listed above may not be a complete list of foods with cholesterol. Exact amounts of cholesterol in these foods may vary depending on specific ingredients and brands. Contact a dietitian for more information. What foods do not have cholesterol? Most plant-based foods do not have cholesterol unless you combine them with a food that has  cholesterol. Foods without cholesterol include: Grains and cereals. Vegetables. Fruits. Vegetable oils, such as olive, canola, and sunflower oil. Legumes, such as peas, beans, and lentils. Nuts and seeds. Egg whites. The items listed above may not be a complete list of foods that do not have cholesterol. Contact a dietitian for more information. Summary The body needs cholesterol in small amounts, but too much cholesterol can cause damage to the arteries and heart. Cholesterol is found in animal-based foods, such as meat, seafood, and dairy. Generally, low-fat dairy and lean meats have less cholesterol than full-fat dairy and fatty meats. This information is not intended to replace advice given to you by your health care provider. Make sure you discuss any questions you have with your health care provider. Document Revised: 05/05/2020 Document Reviewed: 05/05/2020 Elsevier Patient Education  2024 [employer].
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
## 2022-06-12 ENCOUNTER — Other Ambulatory Visit (HOSPITAL_BASED_OUTPATIENT_CLINIC_OR_DEPARTMENT_OTHER): Payer: Federal, State, Local not specified - PPO

## 2022-08-21 ENCOUNTER — Other Ambulatory Visit: Payer: Self-pay

## 2022-08-21 MED ORDER — VITAMIN D (ERGOCALCIFEROL) 1.25 MG (50000 UNIT) PO CAPS: ORAL_CAPSULE | ORAL | 0 refills | Status: DC

## 2022-08-27 ENCOUNTER — Other Ambulatory Visit: Payer: Self-pay

## 2022-08-27 MED ORDER — VITAMIN D (ERGOCALCIFEROL) 1.25 MG (50000 UNIT) PO CAPS: ORAL_CAPSULE | ORAL | 0 refills | Status: DC

## 2022-09-11 ENCOUNTER — Ambulatory Visit (INDEPENDENT_AMBULATORY_CARE_PROVIDER_SITE_OTHER): Payer: Medicare Other

## 2022-09-11 DIAGNOSIS — Z1231 Encounter for screening mammogram for malignant neoplasm of breast: Secondary | ICD-10-CM | POA: Diagnosis not present

## 2022-09-11 DIAGNOSIS — Z0000 Encounter for general adult medical examination without abnormal findings: Secondary | ICD-10-CM | POA: Diagnosis not present

## 2022-09-11 NOTE — Patient Instructions (Signed)
Ms. Magnone , Thank you for taking time to come for your Medicare Wellness Visit. I appreciate your ongoing commitment to your health goals. Please review the following plan we discussed and let me know if I can assist you in the future.   Referrals/Orders/Follow-Ups/Clinician Recommendations: mammogram  You have an order for:  []   2D Mammogram  [x]   3D Mammogram  []   Bone Density     Please call for appointment:  [facility] [address] [place], [place] [address] [phone]      Make sure to wear two-piece clothing.  No lotions, powders, or deodorants the day of the appointment. Make sure to bring picture ID and insurance card.  Bring list of medications you are currently taking including any supplements.   Schedule your [place] screening mammogram through MyChart!   Log into your MyChart account.  Go to 'Visit' (or 'Appointments' if on mobile App) --> Schedule an Appointment  Under 'Select a Reason for Visit' choose the Mammogram Screening option.  Complete the pre-visit questions and select the time and place that best fits your schedule.    This is a list of the screening recommended for you and due dates:  Health Maintenance  Topic Date Due   Mammogram  05/13/2022   Flu Shot  Never done   COVID-19 Vaccine (4 - 2023-24 season) 09/08/2022   Zoster (Shingles) Vaccine (1 of 2) 12/11/2022*   Pneumonia Vaccine (1 of 1 - PCV) 03/19/2023*   Medicare Annual Wellness Visit  09/11/2023   Colon Cancer Screening  11/28/2029   DEXA scan (bone density measurement)  Completed   Hepatitis C Screening  Completed   HPV Vaccine  Aged Out   DTaP/Tdap/Td vaccine  Discontinued  *Topic was postponed. The date shown is not the original due date.    Advanced directives: (ACP Link)Information on Advanced Care Planning can be found at [facility] Advance Health Care Directives Advance Health Care Directives ([email or web address]   Next  Medicare Annual Wellness Visit scheduled for next year: No, office will schedule  Insert Preventive Care attachment Insert FALL PREVENTION attachment if needed

## 2022-09-11 NOTE — Progress Notes (Signed)
Subjective:   Krista Miles is a 67 y.o. female who presents for an Initial Medicare Annual Wellness Visit.  Visit Complete: Virtual  I connected with  Krista Miles on 09/11/22 by a audio enabled telemedicine application and verified that I am speaking with the correct person using two identifiers.  Patient Location: Home  Provider Location: Office/Clinic  I discussed the limitations of evaluation and management by telemedicine. The patient expressed understanding and agreed to proceed.  Vital Signs: Unable to obtain new vitals due to this being a telehealth visit.  Review of Systems     Cardiac Risk Factors include: advanced age (>7men, >15 women);dyslipidemia     Objective:    Today's Vitals   There is no height or weight on file to calculate BMI.     09/11/2022    9:30 AM 05/17/2022    4:43 PM 07/18/2021    9:31 AM 05/10/2021    9:35 AM 01/02/2014    5:53 PM  Advanced Directives  Does Patient Have a Medical Advance Directive? No No No No No  Would patient like information on creating a medical advance directive?   No - Patient declined No - Patient declined No - patient declined information    Current Medications (verified) Outpatient Encounter Medications as of 09/11/2022  Medication Sig   celecoxib (CELEBREX) 100 MG capsule Take 1 capsule (100 mg total) by mouth 2 (two) times daily.   cyclobenzaprine (FLEXERIL) 5 MG tablet One tab po every pm prn   gabapentin (NEURONTIN) 300 MG capsule Take 1 capsule (300 mg total) by mouth 2 (two) times daily.   Vitamin D, Ergocalciferol, (DRISDOL) 1.25 MG (50000 UNIT) CAPS capsule TAKE 1 CAPSULE EVERY WEEK ON TUESDAYS AND THURSDAYS.   Acetaminophen-Codeine 300-30 MG tablet Take 1 tablet by mouth every 6 (six) hours as needed. (Patient not taking: Reported on 05/10/2021)   No facility-administered encounter medications on file as of 09/11/2022.    Allergies (verified) Patient has no known allergies.   History: Past  Medical History:  Diagnosis Date   Back pain    Hyperlipidemia    Prediabetes    Vitamin D deficiency    Past Surgical History:  Procedure Laterality Date   CESAREAN SECTION     HAND SURGERY Bilateral 2016   KNEE ARTHROSCOPY Left 11/21/2021   In [place]   TRIGGER FINGER RELEASE Left 10/04/2017   Dr. Dorene Grebe   TRIGGER FINGER RELEASE Right 05/06/2018   right ring finger   Family History  Problem Relation Age of Onset   Dementia Mother    Cancer Father    Leukemia Father    Cancer Maternal Aunt    Social History   Socioeconomic History   Marital status: Single    Spouse name: Not on file   Number of children: Not on file   Years of education: Not on file   Highest education level: Not on file  Occupational History   Occupation: [employer]  Tobacco Use   Smoking status: Never   Smokeless tobacco: Never  Vaping Use   Vaping status: Never Used  Substance and Sexual Activity   Alcohol use: No   Drug use: No   Sexual activity: Not on file  Other Topics Concern   Not on file  Social History Narrative   Not on file   Social Determinants of Health   Financial Resource Strain: Low Risk  (09/11/2022)   Overall Financial Resource Strain (CARDIA)    Difficulty of Paying  Living Expenses: Not hard at all  Food Insecurity: No Food Insecurity (09/11/2022)   Hunger Vital Sign    Worried About Running Out of Food in the Last Year: Never true    Ran Out of Food in the Last Year: Never true  Transportation Needs: No Transportation Needs (09/11/2022)   PRAPARE - Administrator, Civil Service (Medical): No    Lack of Transportation (Non-Medical): No  Physical Activity: Sufficiently Active (09/11/2022)   Exercise Vital Sign    Days of Exercise per Week: 3 days    Minutes of Exercise per Session: 60 min  Stress: No Stress Concern Present (09/11/2022)   [employer] of Occupational Health - Occupational Stress Questionnaire    Feeling of Stress : Not at all  Social  Connections: Moderately Integrated (09/11/2022)   Social Connection and Isolation Panel [NHANES]    Frequency of Communication with Friends and Family: More than three times a week    Frequency of Social Gatherings with Friends and Family: More than three times a week    Attends Religious Services: More than 4 times per year    Active Member of [employer] or Organizations: Yes    Attends Engineer, structural: More than 4 times per year    Marital Status: Never married    Tobacco Counseling Counseling given: Not Answered   Clinical Intake:  Pre-visit preparation completed: Yes  Pain : No/denies pain     Nutritional Risks: None Diabetes: No  How often do you need to have someone help you when you read instructions, pamphlets, or other written materials from your doctor or pharmacy?: 1 - Never  Interpreter Needed?: No  Information entered by :: NAllen LPN   Activities of Daily Living    09/11/2022    9:26 AM  In your present state of health, do you have any difficulty performing the following activities:  Hearing? 0  Vision? 0  Difficulty concentrating or making decisions? 0  Walking or climbing stairs? 1  Comment due to knee  Dressing or bathing? 0  Doing errands, shopping? 0  Preparing Food and eating ? N  Using the Toilet? N  In the past six months, have you accidently leaked urine? N  Do you have problems with loss of bowel control? N  Managing your Medications? N  Managing your Finances? N  Housekeeping or managing your Housekeeping? N    Patient Care Team: Dorothyann Peng, MD as PCP - General (Internal Medicine)  Indicate any recent Medical Services you may have received from other than [facility] providers in the past year (date may be approximate).     Assessment:   This is a routine wellness examination for Krista Miles.  Hearing/Vision screen Hearing Screening - Comments:: Denies hearing issues Vision Screening - Comments:: Regular eye exams,  [facility]  Dietary issues and exercise activities discussed:     Goals Addressed             This Visit's Progress    Patient Stated       09/11/2022, wants to lose weight       Depression Screen    09/11/2022    9:31 AM 05/30/2022    2:52 PM 03/19/2022    8:41 AM 10/29/2021   10:21 AM 02/07/2021    3:05 PM 07/27/2019    1:04 PM 02/18/2019   11:05 AM  PHQ 2/9 Scores  PHQ - 2 Score 0 0 0 0 0 0 0  PHQ- 9 Score 0  2    Fall Risk    09/11/2022    9:31 AM 05/30/2022    2:52 PM 03/19/2022    8:41 AM 10/29/2021   10:21 AM 03/19/2021   10:15 AM  Fall Risk   Falls in the past year? 0 0 0 0 0  Number falls in past yr: 0 0 0 0 0  Injury with Fall? 0 0 0 0 0  Risk for fall due to : Medication side effect No Fall Risks No Fall Risks No Fall Risks No Fall Risks  Follow up Falls prevention discussed;Falls evaluation completed Falls evaluation completed Falls evaluation completed Falls evaluation completed Falls evaluation completed    MEDICARE RISK AT HOME: Medicare Risk at Home Any stairs in or around the home?: No If so, are there any without handrails?: No Home free of loose throw rugs in walkways, pet beds, electrical cords, etc?: Yes Adequate lighting in your home to reduce risk of falls?: Yes Life alert?: No Use of a cane, walker or w/c?: No Grab bars in the bathroom?: Yes Shower chair or bench in shower?: Yes Elevated toilet seat or a handicapped toilet?: Yes  TIMED UP AND GO:  Was the test performed? No    Cognitive Function:        09/11/2022    9:32 AM  6CIT Screen  What Year? 0 points  What month? 0 points  What time? 0 points  Count back from 20 0 points  Months in reverse 0 points  Repeat phrase 4 points  Total Score 4 points    Immunizations Immunization History  Administered Date(s) Administered   PFIZER(Purple Top)SARS-COV-2 Vaccination 03/16/2019, 04/13/2019, 12/28/2019   PPD Test 01/13/2018    TDAP status: Up to date  Flu Vaccine status:  Declined, Education has been provided regarding the importance of this vaccine but patient still declined. Advised may receive this vaccine at local pharmacy or Health Dept. Aware to provide a copy of the vaccination record if obtained from local pharmacy or Health Dept. Verbalized acceptance and understanding.  Pneumococcal vaccine status: Declined,  Education has been provided regarding the importance of this vaccine but patient still declined. Advised may receive this vaccine at local pharmacy or Health Dept. Aware to provide a copy of the vaccination record if obtained from local pharmacy or Health Dept. Verbalized acceptance and understanding.   Covid-19 vaccine status: Information provided on how to obtain vaccines.   Qualifies for Shingles Vaccine? Yes   Zostavax completed No   Shingrix Completed?: No.    Education has been provided regarding the importance of this vaccine. Patient has been advised to call insurance company to determine out of pocket expense if they have not yet received this vaccine. Advised may also receive vaccine at local pharmacy or Health Dept. Verbalized acceptance and understanding.  Screening Tests Health Maintenance  Topic Date Due   MAMMOGRAM  05/13/2022   INFLUENZA VACCINE  Never done   COVID-19 Vaccine (4 - 2023-24 season) 09/08/2022   Zoster Vaccines- Shingrix (1 of 2) 12/11/2022 (Originally 03/14/2004)   Pneumonia Vaccine 27+ Years old (1 of 1 - PCV) 03/19/2023 (Originally 03/15/2019)   Medicare Annual Wellness (AWV)  09/11/2023   Colonoscopy  11/28/2029   DEXA SCAN  Completed   Hepatitis C Screening  Completed   HPV VACCINES  Aged Out   DTaP/Tdap/Td  Discontinued    Health Maintenance  Health Maintenance Due  Topic Date Due   MAMMOGRAM  05/13/2022   INFLUENZA VACCINE  Never  done   COVID-19 Vaccine (4 - 2023-24 season) 09/08/2022    Colorectal cancer screening: Type of screening: Colonoscopy. Completed 11/29/2019. Repeat every 10  years  Mammogram status: Ordered today. Pt provided with contact info and advised to call to schedule appt.   Bone Density status: Completed 02/11/2019.   Lung Cancer Screening: (Low Dose CT Chest recommended if Age 70-80 years, 20 pack-year currently smoking OR have quit w/in 15years.) does not qualify.   Lung Cancer Screening Referral: no  Additional Screening:  Hepatitis C Screening: does qualify; Completed 05/15/2015  Vision Screening: Recommended annual ophthalmology exams for early detection of glaucoma and other disorders of the eye. Is the patient up to date with their annual eye exam?  Yes  Who is the provider or what is the name of the office in which the patient attends annual eye exams? [facility] If pt is not established with a provider, would they like to be referred to a provider to establish care? No .   Dental Screening: Recommended annual dental exams for proper oral hygiene  Diabetic Foot Exam: n/a  Community Resource Referral / Chronic Care Management: CRR required this visit?  No   CCM required this visit?  No     Plan:     I have personally reviewed and noted the following in the patient's chart:   Medical and social history Use of alcohol, tobacco or illicit drugs  Current medications and supplements including opioid prescriptions. Patient is not currently taking opioid prescriptions. Functional ability and status Nutritional status Physical activity Advanced directives List of other physicians Hospitalizations, surgeries, and ER visits in previous 12 months Vitals Screenings to include cognitive, depression, and falls Referrals and appointments  In addition, I have reviewed and discussed with patient certain preventive protocols, quality metrics, and best practice recommendations. A written personalized care plan for preventive services as well as general preventive health recommendations were provided to patient.     Barb Merino,  LPN   01/12/1094   After Visit Summary: (Pick Up) Due to this being a telephonic visit, with patients personalized plan was offered to patient and patient has requested to Pick up at office.  Nurse Notes: none

## 2022-09-18 NOTE — Patient Instructions (Signed)

## 2022-09-19 ENCOUNTER — Ambulatory Visit (INDEPENDENT_AMBULATORY_CARE_PROVIDER_SITE_OTHER): Payer: Medicare Other | Admitting: Internal Medicine

## 2022-09-19 ENCOUNTER — Encounter: Payer: Self-pay | Admitting: Internal Medicine

## 2022-09-19 VITALS — BP 118/70 | HR 84 | Temp 98.5°F | Ht 61.0 in | Wt 159.8 lb

## 2022-09-19 DIAGNOSIS — E7800 Pure hypercholesterolemia, unspecified: Secondary | ICD-10-CM | POA: Diagnosis not present

## 2022-09-19 DIAGNOSIS — Z6830 Body mass index (BMI) 30.0-30.9, adult: Secondary | ICD-10-CM | POA: Diagnosis not present

## 2022-09-19 DIAGNOSIS — E6609 Other obesity due to excess calories: Secondary | ICD-10-CM

## 2022-09-19 DIAGNOSIS — R7309 Other abnormal glucose: Secondary | ICD-10-CM | POA: Diagnosis not present

## 2022-09-19 NOTE — Progress Notes (Signed)
I,Victoria T Deloria Lair, CMA,acting as a Neurosurgeon for Gwynneth Aliment, MD.,have documented all relevant documentation on the behalf of Gwynneth Aliment, MD,as directed by  Gwynneth Aliment, MD while in the presence of Gwynneth Aliment, MD.  Subjective:  Patient ID: Krista Miles , female    DOB: [birth year] , 67 y.o.   MRN: [id number]  Chief Complaint  Patient presents with   Hyperlipidemia    HPI  The patient is here today for a follow-up on her cholesterol. She currently does not take any prescribed medication for cholesterol. She denies having any specific questions or concerns.   She states she will call to schedule her mammogram.           Past Medical History:  Diagnosis Date   Back pain    Hyperlipidemia    Prediabetes    Vitamin D deficiency      Family History  Problem Relation Age of Onset   Dementia Mother    Cancer Father    Leukemia Father    Cancer Maternal Aunt      Current Outpatient Medications:    celecoxib (CELEBREX) 100 MG capsule, Take 1 capsule (100 mg total) by mouth 2 (two) times daily., Disp: 60 capsule, Rfl: 2   cyclobenzaprine (FLEXERIL) 5 MG tablet, One tab po every pm prn, Disp: 30 tablet, Rfl: 0   gabapentin (NEURONTIN) 300 MG capsule, Take 1 capsule (300 mg total) by mouth 2 (two) times daily., Disp: 180 capsule, Rfl: 1   Vitamin D, Ergocalciferol, (DRISDOL) 1.25 MG (50000 UNIT) CAPS capsule, TAKE 1 CAPSULE EVERY WEEK ON TUESDAYS AND THURSDAYS., Disp: 24 capsule, Rfl: 0   Acetaminophen-Codeine 300-30 MG tablet, Take 1 tablet by mouth every 6 (six) hours as needed. (Patient not taking: Reported on 05/10/2021), Disp: , Rfl:    No Known Allergies   Review of Systems  Constitutional: Negative.   Respiratory: Negative.    Cardiovascular: Negative.   Gastrointestinal: Negative.   Neurological: Negative.   Psychiatric/Behavioral: Negative.       Today's Vitals   09/19/22 0831  BP: 118/70  Pulse: 84  Temp: 98.5 F (36.9 C)  SpO2: 98%   Weight: 159 lb 12.8 oz (72.5 kg)  Height: 5\' 1"  (1.549 m)   Body mass index is 30.19 kg/m.  Wt Readings from Last 3 Encounters:  09/19/22 159 lb 12.8 oz (72.5 kg)  05/30/22 158 lb 12.8 oz (72 kg)  03/19/22 160 lb 6.4 oz (72.8 kg)     Objective:  Physical Exam Vitals and nursing note reviewed.  Constitutional:      Appearance: Normal appearance.  HENT:     Head: Normocephalic and atraumatic.  Eyes:     Extraocular Movements: Extraocular movements intact.  Cardiovascular:     Rate and Rhythm: Normal rate and regular rhythm.     Heart sounds: Normal heart sounds.  Pulmonary:     Effort: Pulmonary effort is normal.     Breath sounds: Normal breath sounds.  Musculoskeletal:     Cervical back: Normal range of motion.  Skin:    General: Skin is warm.  Neurological:     General: No focal deficit present.     Mental Status: She is alert.  Psychiatric:        Mood and Affect: Mood normal.        Behavior: Behavior normal.         Assessment And Plan:  Pure hypercholesterolemia Assessment & Plan: Chronic, she is encouraged  to schedule cardiac calcium scoring. I will check Lipoprotein (a).  I will refer to Cardiology as needed.   Orders: -     CMP14+EGFR -     Lipid panel -     Lipoprotein A (LPA)  Abnormal glucose Assessment & Plan: Previous labs reviewed, her A1c has been elevated in the past. I will check an A1c today. Reminded to avoid refined sugars including sugary drinks/foods and processed meats including bacon, sausages and deli meats.    Orders: -     Hemoglobin A1c  Class 1 obesity due to excess calories without serious comorbidity with body mass index (BMI) of 30.0 to 30.9 in adult Assessment & Plan: She is encouraged to follow low sodium diet.   She is encouraged to strive for BMI less than 30 to decrease cardiac risk. Advised to aim for at least 150 minutes of exercise per week.    Return in 6 months (on 03/19/2023), or physical(can't bill).   Patient was given opportunity to ask questions. Patient verbalized understanding of the plan and was able to repeat key elements of the plan. All questions were answered to their satisfaction.    I, Gwynneth Aliment, MD, have reviewed all documentation for this visit. The documentation on 09/19/22 for the exam, diagnosis, procedures, and orders are all accurate and complete.   IF YOU HAVE BEEN REFERRED TO A SPECIALIST, IT MAY TAKE 1-2 WEEKS TO SCHEDULE/PROCESS THE REFERRAL. IF YOU HAVE NOT HEARD FROM US/SPECIALIST IN TWO WEEKS, PLEASE GIVE [place] A CALL AT [phone].   THE PATIENT IS ENCOURAGED TO PRACTICE SOCIAL DISTANCING DUE TO THE COVID-19 PANDEMIC.

## 2022-09-20 DIAGNOSIS — E6609 Other obesity due to excess calories: Secondary | ICD-10-CM | POA: Insufficient documentation

## 2022-09-20 NOTE — Assessment & Plan Note (Signed)
Chronic, she is encouraged to schedule cardiac calcium scoring. I will check Lipoprotein (a).  I will refer to Cardiology as needed.

## 2022-09-20 NOTE — Assessment & Plan Note (Signed)
Previous labs reviewed, her A1c has been elevated in the past. I will check an A1c today. Reminded to avoid refined sugars including sugary drinks/foods and processed meats including bacon, sausages and deli meats.  

## 2022-09-20 NOTE — Assessment & Plan Note (Signed)
She is encouraged to follow low sodium diet.

## 2022-09-24 LAB — CMP14+EGFR
ALT: 11 IU/L (ref 0–32)
AST: 14 IU/L (ref 0–40)
Albumin: 4.5 g/dL (ref 3.9–4.9)
Alkaline Phosphatase: 83 IU/L (ref 44–121)
BUN/Creatinine Ratio: 13 (ref 12–28)
BUN: 11 mg/dL (ref 8–27)
Bilirubin Total: 0.3 mg/dL (ref 0.0–1.2)
CO2: 24 mmol/L (ref 20–29)
Calcium: 9.7 mg/dL (ref 8.7–10.3)
Chloride: 100 mmol/L (ref 96–106)
Creatinine, Ser: 0.85 mg/dL (ref 0.57–1.00)
Globulin, Total: 2.5 g/dL (ref 1.5–4.5)
Glucose: 88 mg/dL (ref 70–99)
Potassium: 4.5 mmol/L (ref 3.5–5.2)
Sodium: 141 mmol/L (ref 134–144)
Total Protein: 7 g/dL (ref 6.0–8.5)
eGFR: 75 mL/min/{1.73_m2} (ref >59)

## 2022-09-24 LAB — HEMOGLOBIN A1C
Est. average glucose Bld gHb Est-mCnc: 114 mg/dL
Hgb A1c MFr Bld: 5.6 % (ref 4.8–5.6)

## 2022-09-24 LAB — LIPID PANEL
Chol/HDL Ratio: 3.8 ratio (ref 0.0–4.4)
Cholesterol, Total: 284 mg/dL — ABNORMAL HIGH (ref 100–199)
HDL: 75 mg/dL (ref >39)
LDL Chol Calc (NIH): 193 mg/dL — ABNORMAL HIGH (ref 0–99)
Triglycerides: 93 mg/dL (ref 0–149)
VLDL Cholesterol Cal: 16 mg/dL (ref 5–40)

## 2022-09-24 LAB — LIPOPROTEIN A (LPA): Lipoprotein (a): 211.5 nmol/L — ABNORMAL HIGH (ref <75.0)

## 2022-10-14 ENCOUNTER — Ambulatory Visit (HOSPITAL_BASED_OUTPATIENT_CLINIC_OR_DEPARTMENT_OTHER)
Admission: RE | Admit: 2022-10-14 | Discharge: 2022-10-14 | Disposition: A | Discharge status: Home / Self Care | Payer: Federal, State, Local not specified - PPO | Source: Ambulatory Visit | Attending: Internal Medicine | Admitting: Internal Medicine

## 2022-10-14 DIAGNOSIS — E7800 Pure hypercholesterolemia, unspecified: Secondary | ICD-10-CM | POA: Insufficient documentation

## 2022-10-29 ENCOUNTER — Ambulatory Visit: Payer: Federal, State, Local not specified - PPO

## 2022-11-06 ENCOUNTER — Other Ambulatory Visit: Payer: Self-pay

## 2022-11-06 MED ORDER — ATORVASTATIN CALCIUM 20 MG PO TABS: 20.0000 mg | ORAL_TABLET | Freq: Every day | ORAL | 0 refills | Status: DC

## 2022-11-11 ENCOUNTER — Ambulatory Visit (HOSPITAL_COMMUNITY)
Admission: EM | Admit: 2022-11-11 | Discharge: 2022-11-11 | Disposition: A | Discharge status: Home / Self Care | Payer: Federal, State, Local not specified - PPO | Attending: Emergency Medicine | Admitting: Emergency Medicine

## 2022-11-11 ENCOUNTER — Encounter (HOSPITAL_COMMUNITY): Payer: Self-pay

## 2022-11-11 DIAGNOSIS — B9689 Other specified bacterial agents as the cause of diseases classified elsewhere: Secondary | ICD-10-CM | POA: Diagnosis not present

## 2022-11-11 DIAGNOSIS — J019 Acute sinusitis, unspecified: Secondary | ICD-10-CM | POA: Diagnosis not present

## 2022-11-11 MED ORDER — AMOXICILLIN-POT CLAVULANATE 875-125 MG PO TABS: 1.0000 | ORAL_TABLET | Freq: Two times a day (BID) | ORAL | 0 refills | Status: DC

## 2022-11-11 NOTE — ED Triage Notes (Signed)
Pt presents to the office for cough and nasal congestion x 1 week. Pt is taking Nyquil with no relief.

## 2022-11-11 NOTE — ED Provider Notes (Signed)
[facility]    CSN: [id number] Arrival date & time: 11/11/22  1100      History   Chief Complaint Chief Complaint  Patient presents with   Nasal Congestion   Cough   Headache    HPI Krista Miles is a 67 y.o. female.   Patient presents to clinic complaining of nasal congestion, sinus pressure and frontal headache for the past week.  Over the past few days she has developed a nonproductive cough.  She has been taking NyQuil and over-the-counter cough drops without much relief.  Her nasal drainage is clear.  She has been waking up with a headache.  She denies any fevers, wheezing, or shortness of breath.  Reports she thought her symptoms would get better but they have been gradually worsening over the past week.    The history is provided by the patient and medical records.  Cough Headache   Past Medical History:  Diagnosis Date   Back pain    Hyperlipidemia    Prediabetes    Vitamin D deficiency     Patient Active Problem List   Diagnosis Date Noted   Class 1 obesity due to excess calories without serious comorbidity with body mass index (BMI) of 30.0 to 30.9 in adult 09/20/2022   Acute left-sided low back pain without sciatica 12/07/2017   Urinary frequency 12/07/2017   Hyperlipidemia 10/28/2017   Abnormal glucose 10/28/2017    Past Surgical History:  Procedure Laterality Date   CESAREAN SECTION     HAND SURGERY Bilateral 2016   KNEE ARTHROSCOPY Left 11/21/2021   In [place]   TRIGGER FINGER RELEASE Left 10/04/2017   Dr. Dorene Grebe   TRIGGER FINGER RELEASE Right 05/06/2018   right ring finger    OB History     Gravida  2   Para      Term      Preterm      AB      Living  2      SAB      IAB      Ectopic      Multiple      Live Births               Home Medications    Prior to Admission medications   Medication Sig Start Date End Date Taking? Authorizing Provider  amoxicillin-clavulanate (AUGMENTIN)  875-125 MG tablet Take 1 tablet by mouth every 12 (twelve) hours. 11/11/22  Yes Rinaldo Ratel, [place] N, FNP  atorvastatin (LIPITOR) 20 MG tablet Take 1 tablet (20 mg total) by mouth daily. 11/06/22 11/06/23 Yes Dorothyann Peng, MD  gabapentin (NEURONTIN) 300 MG capsule Take 1 capsule (300 mg total) by mouth 2 (two) times daily. 03/19/22  Yes Dorothyann Peng, MD  Vitamin D, Ergocalciferol, (DRISDOL) 1.25 MG (50000 UNIT) CAPS capsule TAKE 1 CAPSULE EVERY WEEK ON TUESDAYS AND THURSDAYS. 08/27/22  Yes Dorothyann Peng, MD  Acetaminophen-Codeine 300-30 MG tablet Take 1 tablet by mouth every 6 (six) hours as needed. Patient not taking: Reported on 05/10/2021 01/31/21   [provider]  cyclobenzaprine (FLEXERIL) 5 MG tablet One tab po every pm prn 05/30/22   Dorothyann Peng, MD    Family History Family History  Problem Relation Age of Onset   Dementia Mother    Cancer Father    Leukemia Father    Cancer Maternal Aunt     Social History Social History   Tobacco Use   Smoking status: Never   Smokeless tobacco:  Never  Vaping Use   Vaping status: Never Used  Substance Use Topics   Alcohol use: No   Drug use: No     Allergies   Patient has no known allergies.   Review of Systems Review of Systems  Per HPI   Physical Exam Triage Vital Signs ED Triage Vitals [11/11/22 1211]  Encounter Vitals Group     BP 128/88     Systolic BP Percentile      Diastolic BP Percentile      Pulse Rate 86     Resp 18     Temp 98.5 F (36.9 C)     Temp Source Oral     SpO2 98 %     Weight      Height      Head Circumference      Peak Flow      Pain Score      Pain Loc      Pain Education      Exclude from Growth Chart    No data found.  Updated Vital Signs BP 128/88 (BP Location: Left Arm)   Pulse 86   Temp 98.5 F (36.9 C) (Oral)   Resp 18   SpO2 98%   Visual Acuity Right Eye Distance:   Left Eye Distance:   Bilateral Distance:    Right Eye Near:   Left Eye Near:    Bilateral  Near:     Physical Exam Vitals and nursing note reviewed.  Constitutional:      Appearance: Normal appearance.  HENT:     Head: Normocephalic and atraumatic.     Right Ear: External ear normal.     Left Ear: External ear normal.     Nose: Congestion and rhinorrhea present.     Mouth/Throat:     Mouth: Mucous membranes are moist.     Pharynx: Posterior oropharyngeal erythema present.  Eyes:     Conjunctiva/sclera: Conjunctivae normal.  Cardiovascular:     Rate and Rhythm: Normal rate and regular rhythm.     Heart sounds: Normal heart sounds. No murmur heard. Pulmonary:     Effort: Pulmonary effort is normal. No respiratory distress.     Breath sounds: Normal breath sounds.  Musculoskeletal:        General: Normal range of motion.  Lymphadenopathy:     Cervical: Cervical adenopathy present.  Skin:    General: Skin is warm and dry.  Neurological:     General: No focal deficit present.     Mental Status: She is alert.  Psychiatric:        Mood and Affect: Mood normal.      UC Treatments / Results  Labs (all labs ordered are listed, but only abnormal results are displayed) Labs Reviewed - No data to display  EKG   Radiology No results found.  Procedures Procedures (including critical care time)  Medications Ordered in UC Medications - No data to display  Initial Impression / Assessment and Plan / UC Course  I have reviewed the triage vital signs and the nursing notes.  Pertinent labs & imaging results that were available during my care of the patient were reviewed by me and considered in my medical decision making (see chart for details).  Vitals and triage reviewed, patient is hemodynamically stable.  Frontal sinus tenderness on exam with rhinorrhea and postnasal drip.  Lungs are vesicular, heart with regular rate and rhythm.  Will cover with Augmentin for acute bacterial sinusitis.  Symptomatic  management for cough from postnasal drip and congestion discussed.   Plan of care, follow-up care and return precautions given, no questions at this time.     Final Clinical Impressions(s) / UC Diagnoses   Final diagnoses:  Acute bacterial sinusitis     Discharge Instructions      Take all antibiotics as prescribed and until finished, take them with food to prevent gastrointestinal upset.  You can sleep with a humidifier and take 1200 mg of Mucinex daily in addition to at least 64 ounces of water to help loosen up your secretions.  Continue over-the-counter treatment.  Symptoms should improve over the next few days, if no improvement or any changes please return to clinic.     ED Prescriptions     Medication Sig Dispense Auth. Provider   amoxicillin-clavulanate (AUGMENTIN) 875-125 MG tablet Take 1 tablet by mouth every 12 (twelve) hours. 14 tablet Yalonda Sample, [place] N, [place]      PDMP not reviewed this encounter.   Danish Ruffins, [place] N, [place] 11/11/22 1234

## 2022-11-11 NOTE — Discharge Instructions (Addendum)
Take all antibiotics as prescribed and until finished, take them with food to prevent gastrointestinal upset.  You can sleep with a humidifier and take 1200 mg of Mucinex daily in addition to at least 64 ounces of water to help loosen up your secretions.  Continue over-the-counter treatment.  Symptoms should improve over the next few days, if no improvement or any changes please return to clinic.

## 2022-11-27 ENCOUNTER — Ambulatory Visit: Payer: Federal, State, Local not specified - PPO

## 2022-12-18 ENCOUNTER — Other Ambulatory Visit: Payer: Self-pay

## 2022-12-18 MED ORDER — VITAMIN D (ERGOCALCIFEROL) 1.25 MG (50000 UNIT) PO CAPS: ORAL_CAPSULE | ORAL | 0 refills | Status: DC

## 2022-12-25 ENCOUNTER — Ambulatory Visit
Admission: RE | Admit: 2022-12-25 | Discharge: 2022-12-25 | Disposition: A | Discharge status: Home / Self Care | Payer: Medicare Other | Source: Ambulatory Visit | Attending: Internal Medicine | Admitting: Internal Medicine

## 2022-12-25 DIAGNOSIS — Z1231 Encounter for screening mammogram for malignant neoplasm of breast: Secondary | ICD-10-CM

## 2023-01-15 ENCOUNTER — Other Ambulatory Visit: Payer: Medicare Other

## 2023-01-15 ENCOUNTER — Ambulatory Visit
Admission: RE | Admit: 2023-01-15 | Discharge: 2023-01-15 | Disposition: A | Discharge status: Home / Self Care | Payer: Federal, State, Local not specified - PPO | Source: Ambulatory Visit | Attending: Family Medicine | Admitting: Family Medicine

## 2023-01-15 ENCOUNTER — Encounter: Payer: Self-pay | Admitting: Family Medicine

## 2023-01-15 ENCOUNTER — Ambulatory Visit (INDEPENDENT_AMBULATORY_CARE_PROVIDER_SITE_OTHER): Payer: Medicare Other | Admitting: Family Medicine

## 2023-01-15 VITALS — BP 110/80 | HR 82 | Temp 98.1°F | Ht 61.0 in | Wt 166.0 lb

## 2023-01-15 DIAGNOSIS — Z01818 Encounter for other preprocedural examination: Secondary | ICD-10-CM

## 2023-01-15 DIAGNOSIS — Z79899 Other long term (current) drug therapy: Secondary | ICD-10-CM

## 2023-01-15 DIAGNOSIS — R059 Cough, unspecified: Secondary | ICD-10-CM | POA: Diagnosis not present

## 2023-01-15 DIAGNOSIS — E7800 Pure hypercholesterolemia, unspecified: Secondary | ICD-10-CM | POA: Diagnosis not present

## 2023-01-15 DIAGNOSIS — R823 Hemoglobinuria: Secondary | ICD-10-CM

## 2023-01-15 LAB — POCT URINALYSIS DIPSTICK
Bilirubin, UA: NEGATIVE
Blood, UA: NEGATIVE
Glucose, UA: NEGATIVE
Ketones, UA: NEGATIVE
Nitrite, UA: NEGATIVE
Protein, UA: NEGATIVE
Spec Grav, UA: 1.025 (ref 1.010–1.025)
Urobilinogen, UA: 1 U/dL
pH, UA: 6 (ref 5.0–8.0)

## 2023-01-15 MED ORDER — PREDNISONE 5 MG PO TABS
ORAL_TABLET | ORAL | 0 refills | Status: AC
Start: 2023-01-15 — End: ?

## 2023-01-15 MED ORDER — BENZONATATE 100 MG PO CAPS
100.0000 mg | ORAL_CAPSULE | Freq: Three times a day (TID) | ORAL | 0 refills | Status: AC | PRN
Start: 2023-01-15 — End: 2024-01-15

## 2023-01-15 MED ORDER — ROSUVASTATIN CALCIUM 5 MG PO TABS: 5.0000 mg | ORAL_TABLET | Freq: Every day | ORAL | 11 refills | Status: DC

## 2023-01-15 NOTE — Progress Notes (Signed)
 I,Victoria T Emmitt, CMA,acting as a neurosurgeon for [employer], NP.,have documented all relevant documentation on the behalf of Bruna Creighton, NP,as directed by  Bruna Creighton, NP while in the presence of Bruna Creighton, NP.  Subjective:  Patient ID: Krista Miles , female    DOB: November 16, 1955 , 68 y.o.   MRN: [id number]  Chief Complaint  Patient presents with   Pre-op Exam    HPI  Patient is a 68 year old presents today for pre op examination. She states that she will be having Left knee surgery soon  at [facility] and so she needs to get a pre-op evaluation before she can be scheduled.The name of the surgeon is Dr Neta.  Patient states that also would like to discuss her cholesterol medication. She reports having an episode of break out when she took the Lipitor, she she thinks she may have reacted to it and stopped. Will try another statin e.g Rosuvastatin  and patient voiced understanding.      Past Medical History:  Diagnosis Date   Back pain    Hyperlipidemia    Prediabetes    Vitamin D  deficiency      Family History  Problem Relation Age of Onset   Dementia Mother    Cancer Father    Leukemia Father    Cancer Maternal Aunt      Current Outpatient Medications:    benzonatate  (TESSALON  PERLES) 100 MG capsule, Take 1 capsule (100 mg total) by mouth 3 (three) times daily as needed., Disp: 30 capsule, Rfl: 0   cyclobenzaprine  (FLEXERIL ) 5 MG tablet, One tab po every pm prn, Disp: 30 tablet, Rfl: 0   gabapentin  (NEURONTIN ) 300 MG capsule, Take 1 capsule (300 mg total) by mouth 2 (two) times daily., Disp: 180 capsule, Rfl: 1   predniSONE  (DELTASONE ) 5 MG tablet, Take all meds as directed., Disp: 21 tablet, Rfl: 0   rosuvastatin  (CRESTOR ) 5 MG tablet, Take 1 tablet (5 mg total) by mouth daily., Disp: 30 tablet, Rfl: 11   Vitamin D , Ergocalciferol , (DRISDOL ) 1.25 MG (50000 UNIT) CAPS capsule, TAKE 1 CAPSULE EVERY WEEK ON TUESDAYS AND THURSDAYS., Disp: 24 capsule,  Rfl: 0   Acetaminophen-Codeine 300-30 MG tablet, Take 1 tablet by mouth every 6 (six) hours as needed. (Patient not taking: Reported on 05/10/2021), Disp: , Rfl:    amoxicillin -clavulanate (AUGMENTIN ) 875-125 MG tablet, Take 1 tablet by mouth every 12 (twelve) hours. (Patient not taking: Reported on 01/15/2023), Disp: 14 tablet, Rfl: 0   No Known Allergies   Review of Systems  Constitutional: Negative.   HENT: Negative.    Eyes: Negative.   Respiratory:  Positive for cough.   Cardiovascular: Negative.   Endocrine: Negative.   Musculoskeletal: Negative.   Neurological: Negative.   Psychiatric/Behavioral: Negative.       Today's Vitals   01/15/23 0919  BP: 110/80  Pulse: 82  Temp: 98.1 F (36.7 C)  Weight: 166 lb (75.3 kg)  Height: 5' 1 (1.549 m)  PainSc: 0-No pain   Body mass index is 31.37 kg/m.  Wt Readings from Last 3 Encounters:  01/15/23 166 lb (75.3 kg)  09/19/22 159 lb 12.8 oz (72.5 kg)  05/30/22 158 lb 12.8 oz (72 kg)     Objective:  Physical Exam HENT:     Head: Normocephalic.     Nose: No congestion or rhinorrhea.  Cardiovascular:     Rate and Rhythm: Normal rate and regular rhythm.  Pulmonary:     Effort:  Pulmonary effort is normal.  Abdominal:     General: Bowel sounds are normal.  Musculoskeletal:        General: Normal range of motion.     Cervical back: Normal range of motion.  Skin:    General: Skin is warm and dry.  Neurological:     General: No focal deficit present.     Mental Status: She is alert and oriented to person, place, and time. Mental status is at baseline.  Psychiatric:        Mood and Affect: Mood normal.        Behavior: Behavior normal.         Assessment And Plan:  Pre-op evaluation -     EKG 12-Lead -     CMP14+EGFR -     CBC with Differential/Platelet -     POCT urinalysis dipstick -     DG Chest 2 View; Future -     Protime-INR  Cough in adult -     Benzonatate ; Take 1 capsule (100 mg total) by mouth 3 (three)  times daily as needed.  Dispense: 30 capsule; Refill: 0 -     predniSONE ; Take all meds as directed.  Dispense: 21 tablet; Refill: 0  Pure hypercholesterolemia -     Rosuvastatin  Calcium ; Take 1 tablet (5 mg total) by mouth daily.  Dispense: 30 tablet; Refill: 11     Return if symptoms worsen or fail to improve, for keep next appt.  Patient was given opportunity to ask questions. Patient verbalized understanding of the plan and was able to repeat key elements of the plan. All questions were answered to their satisfaction.  Bruna Creighton, NP  I, Bruna Creighton, NP, have reviewed all documentation for this visit. The documentation on 01/19/2023 for the exam, diagnosis, procedures, and orders are all accurate and complete.   IF YOU HAVE BEEN REFERRED TO A SPECIALIST, IT MAY TAKE 1-2 WEEKS TO SCHEDULE/PROCESS THE REFERRAL. IF YOU HAVE NOT HEARD FROM US /SPECIALIST IN TWO WEEKS, PLEASE GIVE US  A CALL AT [phone].   THE PATIENT IS ENCOURAGED TO PRACTICE SOCIAL DISTANCING DUE TO THE COVID-19 PANDEMIC.

## 2023-01-16 LAB — CBC WITH DIFFERENTIAL/PLATELET
Basophils Absolute: 0 10*3/uL (ref 0.0–0.2)
Basos: 1 %
EOS (ABSOLUTE): 0.1 10*3/uL (ref 0.0–0.4)
Eos: 2 %
Hematocrit: 36.4 % (ref 34.0–46.6)
Hemoglobin: 11.9 g/dL (ref 11.1–15.9)
Immature Grans (Abs): 0 10*3/uL (ref 0.0–0.1)
Immature Granulocytes: 0 %
Lymphocytes Absolute: 1.5 10*3/uL (ref 0.7–3.1)
Lymphs: 32 %
MCH: 30 pg (ref 26.6–33.0)
MCHC: 32.7 g/dL (ref 31.5–35.7)
MCV: 92 fL (ref 79–97)
Monocytes Absolute: 0.2 10*3/uL (ref 0.1–0.9)
Monocytes: 5 %
Neutrophils Absolute: 2.8 10*3/uL (ref 1.4–7.0)
Neutrophils: 60 %
Platelets: 272 10*3/uL (ref 150–450)
RBC: 3.97 x10E6/uL (ref 3.77–5.28)
RDW: 11.9 % (ref 11.7–15.4)
WBC: 4.6 10*3/uL (ref 3.4–10.8)

## 2023-01-16 LAB — CMP14+EGFR
ALT: 9 [IU]/L (ref 0–32)
AST: 13 [IU]/L (ref 0–40)
Albumin: 4.3 g/dL (ref 3.9–4.9)
Alkaline Phosphatase: 78 [IU]/L (ref 44–121)
BUN/Creatinine Ratio: 15 (ref 12–28)
BUN: 11 mg/dL (ref 8–27)
Bilirubin Total: 0.3 mg/dL (ref 0.0–1.2)
CO2: 25 mmol/L (ref 20–29)
Calcium: 9 mg/dL (ref 8.7–10.3)
Chloride: 105 mmol/L (ref 96–106)
Creatinine, Ser: 0.75 mg/dL (ref 0.57–1.00)
Globulin, Total: 2.4 g/dL (ref 1.5–4.5)
Glucose: 104 mg/dL — ABNORMAL HIGH (ref 70–99)
Potassium: 4.1 mmol/L (ref 3.5–5.2)
Sodium: 144 mmol/L (ref 134–144)
Total Protein: 6.7 g/dL (ref 6.0–8.5)
eGFR: 87 mL/min/{1.73_m2} (ref >59)

## 2023-01-16 LAB — PROTIME-INR
INR: 0.9 (ref 0.9–1.2)
Prothrombin Time: 10.3 s (ref 9.1–12.0)

## 2023-01-20 NOTE — Progress Notes (Signed)
 Labs are normal.

## 2023-01-24 ENCOUNTER — Encounter: Payer: Self-pay | Admitting: Family Medicine

## 2023-03-20 ENCOUNTER — Ambulatory Visit: Payer: Federal, State, Local not specified - PPO | Admitting: Internal Medicine

## 2023-03-26 ENCOUNTER — Ambulatory Visit: Admitting: Internal Medicine

## 2023-03-26 NOTE — Progress Notes (Deleted)
 I,Terrel Manalo T Deloria Lair, CMA,acting as a Neurosurgeon for Gwynneth Aliment, MD.,have documented all relevant documentation on the behalf of Gwynneth Aliment, MD,as directed by  Gwynneth Aliment, MD while in the presence of Gwynneth Aliment, MD.  Subjective:  Patient ID: Krista Miles , female    DOB: [birth year] , 68 y.o.   MRN: [id number]  No chief complaint on file.   HPI  The patient is here today for a follow-up on her cholesterol. She currently does not take any prescribed medication for cholesterol. She denies having any specific questions or concerns.          Past Medical History:  Diagnosis Date  . Back pain   . Hyperlipidemia   . Prediabetes   . Vitamin D deficiency      Family History  Problem Relation Age of Onset  . Dementia Mother   . Cancer Father   . Leukemia Father   . Cancer Maternal Aunt      Current Outpatient Medications:  .  Acetaminophen-Codeine 300-30 MG tablet, Take 1 tablet by mouth every 6 (six) hours as needed. (Patient not taking: Reported on 05/10/2021), Disp: , Rfl:  .  amoxicillin-clavulanate (AUGMENTIN) 875-125 MG tablet, Take 1 tablet by mouth every 12 (twelve) hours. (Patient not taking: Reported on 01/15/2023), Disp: 14 tablet, Rfl: 0 .  benzonatate (TESSALON PERLES) 100 MG capsule, Take 1 capsule (100 mg total) by mouth 3 (three) times daily as needed., Disp: 30 capsule, Rfl: 0 .  cyclobenzaprine (FLEXERIL) 5 MG tablet, One tab po every pm prn, Disp: 30 tablet, Rfl: 0 .  gabapentin (NEURONTIN) 300 MG capsule, Take 1 capsule (300 mg total) by mouth 2 (two) times daily., Disp: 180 capsule, Rfl: 1 .  predniSONE (DELTASONE) 5 MG tablet, Take all meds as directed., Disp: 21 tablet, Rfl: 0 .  rosuvastatin (CRESTOR) 5 MG tablet, Take 1 tablet (5 mg total) by mouth daily., Disp: 30 tablet, Rfl: 11 .  Vitamin D, Ergocalciferol, (DRISDOL) 1.25 MG (50000 UNIT) CAPS capsule, TAKE 1 CAPSULE EVERY WEEK ON TUESDAYS AND THURSDAYS., Disp: 24 capsule, Rfl: 0   No  Known Allergies   Review of Systems  Constitutional: Negative.   Respiratory: Negative.    Cardiovascular: Negative.   Neurological: Negative.   Psychiatric/Behavioral: Negative.      There were no vitals filed for this visit. There is no height or weight on file to calculate BMI.  Wt Readings from Last 3 Encounters:  01/15/23 166 lb (75.3 kg)  09/19/22 159 lb 12.8 oz (72.5 kg)  05/30/22 158 lb 12.8 oz (72 kg)     Objective:  Physical Exam      Assessment And Plan:  Pure hypercholesterolemia  Abnormal glucose     No follow-ups on file.  Patient was given opportunity to ask questions. Patient verbalized understanding of the plan and was able to repeat key elements of the plan. All questions were answered to their satisfaction.  Gwynneth Aliment, MD  I, Gwynneth Aliment, MD, have reviewed all documentation for this visit. The documentation on 03/26/23 for the exam, diagnosis, procedures, and orders are all accurate and complete.   IF YOU HAVE BEEN REFERRED TO A SPECIALIST, IT MAY TAKE 1-2 WEEKS TO SCHEDULE/PROCESS THE REFERRAL. IF YOU HAVE NOT HEARD FROM US/SPECIALIST IN TWO WEEKS, PLEASE GIVE [place] A CALL AT [phone].   THE PATIENT IS ENCOURAGED TO PRACTICE SOCIAL DISTANCING DUE TO THE COVID-19 PANDEMIC.

## 2023-04-02 ENCOUNTER — Ambulatory Visit: Admitting: Family Medicine

## 2023-04-02 ENCOUNTER — Encounter: Payer: Self-pay | Admitting: Family Medicine

## 2023-04-02 VITALS — BP 120/70 | HR 75 | Temp 98.1°F | Ht 61.0 in | Wt 171.0 lb

## 2023-04-02 DIAGNOSIS — Z152 Genetic susceptibility to obesity: Secondary | ICD-10-CM

## 2023-04-02 DIAGNOSIS — E8882 Obesity due to disruption of MC4R pathway: Secondary | ICD-10-CM | POA: Diagnosis not present

## 2023-04-02 DIAGNOSIS — Z6832 Body mass index (BMI) 32.0-32.9, adult: Secondary | ICD-10-CM

## 2023-04-02 DIAGNOSIS — Z79899 Other long term (current) drug therapy: Secondary | ICD-10-CM | POA: Diagnosis not present

## 2023-04-02 DIAGNOSIS — E66811 Obesity, class 1: Secondary | ICD-10-CM

## 2023-04-02 DIAGNOSIS — I672 Cerebral atherosclerosis: Secondary | ICD-10-CM

## 2023-04-02 DIAGNOSIS — Z01818 Encounter for other preprocedural examination: Secondary | ICD-10-CM

## 2023-04-02 LAB — POCT URINALYSIS DIPSTICK
Bilirubin, UA: NEGATIVE
Blood, UA: NEGATIVE
Glucose, UA: NEGATIVE
Ketones, UA: NEGATIVE
Nitrite, UA: NEGATIVE
Protein, UA: NEGATIVE
Spec Grav, UA: 1.025 (ref 1.010–1.025)
Urobilinogen, UA: 0.2 U/dL
pH, UA: 6 (ref 5.0–8.0)

## 2023-04-02 NOTE — Progress Notes (Signed)
 I,Jameka J Llittleton, CMA,acting as a Neurosurgeon for [employer], NP.,have documented all relevant documentation on the behalf of [name], NP,as directed by  [name], NP while in the presence of [name], NP.  Subjective:  Patient ID: Krista Miles , female    DOB: 1955/10/26 , 68 y.o.   MRN: [id number]  Chief Complaint  Patient presents with   Pre-op Exam    HPI  Patient is a 68 year old presents today for pre op examination. She was scheduled to get left knee surgery at  [facility] on 03/19/2023 but she states that she had to reschedule for family reasons and so she needs to get another  pre-op evaluation before she can be scheduled because her last pre-op evaluation was in January and the Labs are over 2 months.The name of the surgeon is Dr Corey Harold. Patient has been on Rosuvastatin 5 mg since her last visit about 6 weeks and has been tolerating it well . She denies any  side effects.      Past Medical History:  Diagnosis Date   Back pain    Hyperlipidemia    Prediabetes    Vitamin D deficiency      Family History  Problem Relation Age of Onset   Dementia Mother    Cancer Father    Leukemia Father    Cancer Maternal Aunt      Current Outpatient Medications:    benzonatate (TESSALON PERLES) 100 MG capsule, Take 1 capsule (100 mg total) by mouth 3 (three) times daily as needed., Disp: 30 capsule, Rfl: 0   cyclobenzaprine (FLEXERIL) 5 MG tablet, One tab po every pm prn, Disp: 30 tablet, Rfl: 0   gabapentin (NEURONTIN) 300 MG capsule, Take 1 capsule (300 mg total) by mouth 2 (two) times daily., Disp: 180 capsule, Rfl: 1   predniSONE (DELTASONE) 5 MG tablet, Take all meds as directed., Disp: 21 tablet, Rfl: 0   rosuvastatin (CRESTOR) 5 MG tablet, Take 1 tablet (5 mg total) by mouth daily., Disp: 30 tablet, Rfl: 11   Vitamin D, Ergocalciferol, (DRISDOL) 1.25 MG (50000 UNIT) CAPS capsule, TAKE 1 CAPSULE EVERY WEEK ON TUESDAYS AND THURSDAYS., Disp: 24  capsule, Rfl: 0   No Known Allergies   Review of Systems  Constitutional: Negative.   HENT: Negative.    Respiratory: Negative.    Cardiovascular: Negative.   Gastrointestinal: Negative.   Genitourinary: Negative.   Musculoskeletal:  Positive for arthralgias.  Psychiatric/Behavioral: Negative.       Today's Vitals   04/02/23 0950  BP: 120/70  Pulse: 75  Temp: 98.1 F (36.7 C)  TempSrc: Oral  Weight: 171 lb (77.6 kg)  Height: 5\' 1"  (1.549 m)  PainSc: 7   PainLoc: Knee   Body mass index is 32.31 kg/m.  Wt Readings from Last 3 Encounters:  04/02/23 171 lb (77.6 kg)  01/15/23 166 lb (75.3 kg)  09/19/22 159 lb 12.8 oz (72.5 kg)    The 10-year ASCVD risk score (Arnett DK, et al., 2019) is: 7.9%   Values used to calculate the score:     Age: 19 years     Sex: Female     Is Non-Hispanic African American: No     Diabetic: No     Tobacco smoker: No     Systolic Blood Pressure: 120 mmHg     Is BP treated: No     HDL Cholesterol: 75 mg/dL     Total Cholesterol: 284 mg/dL  Objective:  Physical Exam HENT:     Head: Normocephalic.  Cardiovascular:     Rate and Rhythm: Normal rate and regular rhythm.  Pulmonary:     Effort: Pulmonary effort is normal.     Breath sounds: Normal breath sounds.  Musculoskeletal:        General: Tenderness present.  Skin:    General: Skin is warm and dry.  Neurological:     Mental Status: She is alert and oriented to person, place, and time.  Psychiatric:        Mood and Affect: Mood normal.        Behavior: Behavior normal.         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 Advised to aim for at least 150 minutes of exercise per week.      No follow-ups on file.  Patient was given opportunity to ask questions. Patient verbalized understanding of the plan and was able to repeat key elements of the plan. All questions were answered to their satisfaction.   I, [name], NP, have reviewed all documentation for this visit. The documentation on 04/07/2023 for the exam, diagnosis, procedures, and orders are all accurate and complete.     IF YOU HAVE BEEN REFERRED TO A SPECIALIST, IT MAY TAKE 1-2 WEEKS TO SCHEDULE/PROCESS THE REFERRAL. IF YOU HAVE NOT HEARD FROM US/SPECIALIST IN TWO WEEKS, PLEASE GIVE [place] A CALL AT [phone] X [phone].

## 2023-04-03 LAB — CBC WITH DIFFERENTIAL/PLATELET
Basophils Absolute: 0 10*3/uL (ref 0.0–0.2)
Basos: 0 %
EOS (ABSOLUTE): 0.1 10*3/uL (ref 0.0–0.4)
Eos: 1 %
Hematocrit: 37.1 % (ref 34.0–46.6)
Hemoglobin: 12 g/dL (ref 11.1–15.9)
Immature Grans (Abs): 0 10*3/uL (ref 0.0–0.1)
Immature Granulocytes: 1 %
Lymphocytes Absolute: 2 10*3/uL (ref 0.7–3.1)
Lymphs: 32 %
MCH: 30.4 pg (ref 26.6–33.0)
MCHC: 32.3 g/dL (ref 31.5–35.7)
MCV: 94 fL (ref 79–97)
Monocytes Absolute: 0.4 10*3/uL (ref 0.1–0.9)
Monocytes: 7 %
Neutrophils Absolute: 3.5 10*3/uL (ref 1.4–7.0)
Neutrophils: 59 %
Platelets: 233 10*3/uL (ref 150–450)
RBC: 3.95 x10E6/uL (ref 3.77–5.28)
RDW: 12 % (ref 11.7–15.4)
WBC: 6 10*3/uL (ref 3.4–10.8)

## 2023-04-03 LAB — CMP14+EGFR
ALT: 11 IU/L (ref 0–32)
AST: 18 IU/L (ref 0–40)
Albumin: 4.4 g/dL (ref 3.9–4.9)
Alkaline Phosphatase: 89 IU/L (ref 44–121)
BUN/Creatinine Ratio: 16 (ref 12–28)
BUN: 14 mg/dL (ref 8–27)
Bilirubin Total: 0.2 mg/dL (ref 0.0–1.2)
CO2: 26 mmol/L (ref 20–29)
Calcium: 9.8 mg/dL (ref 8.7–10.3)
Chloride: 100 mmol/L (ref 96–106)
Creatinine, Ser: 0.89 mg/dL (ref 0.57–1.00)
Globulin, Total: 2.6 g/dL (ref 1.5–4.5)
Glucose: 93 mg/dL (ref 70–99)
Potassium: 5.2 mmol/L (ref 3.5–5.2)
Sodium: 140 mmol/L (ref 134–144)
Total Protein: 7 g/dL (ref 6.0–8.5)
eGFR: 70 mL/min/{1.73_m2} (ref >59)

## 2023-04-03 LAB — PROTIME-INR
INR: 0.9 (ref 0.9–1.2)
Prothrombin Time: 10.4 s (ref 9.1–12.0)

## 2023-04-07 DIAGNOSIS — E8882 Obesity due to disruption of MC4R pathway: Secondary | ICD-10-CM | POA: Insufficient documentation

## 2023-04-07 NOTE — Assessment & Plan Note (Signed)
 She is encouraged to strive for BMI less than 30 to decrease cardiac risk. Advised to aim for at least 150 minutes of exercise per week.

## 2023-04-07 NOTE — Progress Notes (Signed)
 Please verify from patient if she has any UTI symptoms. Urgency,frequency,pain,odor.  Thanks

## 2023-04-07 NOTE — Assessment & Plan Note (Addendum)
 Evaluation for  left knee surgery

## 2023-06-11 ENCOUNTER — Encounter: Payer: Self-pay | Admitting: Internal Medicine

## 2023-06-11 ENCOUNTER — Ambulatory Visit: Payer: Self-pay | Admitting: Internal Medicine

## 2023-06-11 ENCOUNTER — Ambulatory Visit (INDEPENDENT_AMBULATORY_CARE_PROVIDER_SITE_OTHER): Admitting: Internal Medicine

## 2023-06-11 VITALS — BP 112/84 | HR 87 | Temp 98.7°F | Ht 61.0 in | Wt 172.8 lb

## 2023-06-11 DIAGNOSIS — E7800 Pure hypercholesterolemia, unspecified: Secondary | ICD-10-CM

## 2023-06-11 DIAGNOSIS — E559 Vitamin D deficiency, unspecified: Secondary | ICD-10-CM

## 2023-06-11 DIAGNOSIS — G8929 Other chronic pain: Secondary | ICD-10-CM

## 2023-06-11 DIAGNOSIS — G5711 Meralgia paresthetica, right lower limb: Secondary | ICD-10-CM

## 2023-06-11 DIAGNOSIS — R7309 Other abnormal glucose: Secondary | ICD-10-CM

## 2023-06-11 DIAGNOSIS — M25562 Pain in left knee: Secondary | ICD-10-CM | POA: Diagnosis not present

## 2023-06-11 NOTE — Progress Notes (Signed)
 I,Victoria T Basil Lim, CMA,acting as a Neurosurgeon for Smiley Dung, MD.,have documented all relevant documentation on the behalf of Smiley Dung, MD,as directed by  Smiley Dung, MD while in the presence of Smiley Dung, MD.  Subjective:  Patient ID: Krista Miles , female    DOB: 1955-01-18 , 68 y.o.   MRN: [id number]  Chief Complaint  Patient presents with   Burning Sensation Of Skin    Patient presents today for  burning feeling on her right thigh. This has been an on/ off issue for a while. Ongoing for a month. At home she uses Voltaren  gel, which does not help.      HPI Discussed the use of AI scribe software for clinical note transcription with the patient, who gave verbal consent to proceed.  History of Present Illness Krista Miles is a 68 year old female who presents with a burning sensation on her lateral thigh.  She has been experiencing a burning sensation on her lateral thigh for the past couple of months, described as 'burning' and particularly bothersome at night, affecting her sleep. She uses topical treatments such as products similar to Bengay and Voltaren  gel, with limited relief. Gabapentin  is taken every morning and night, sometimes increasing the dose to two pills, but the relief is inconsistent.  She is currently taking cholesterol medication and has had her liver and kidneys checked in March. She reports fluctuations in her weight and attempts to walk daily, covering at least two miles, and sometimes up to four or five miles. The burning sensation does not affect her ability to walk.  She has a history of knee issues, having undergone arthroscopic surgery in [place], which resulted in temporary relief. However, the knee has since become swollen and painful again. She has not been using Voltaren  gel on her knee. She has experienced difficulties with workman's compensation and insurance coverage for her knee treatment.  She has been taking vitamin D ,  previously prescribed at 50,000 IU weekly, but has recently run out. She last took the prescription vitamin D  last week.  Past Medical History:  Diagnosis Date   Back pain    Hyperlipidemia    Prediabetes    Vitamin D  deficiency      Family History  Problem Relation Age of Onset   Dementia Mother    Cancer Father    Leukemia Father    Cancer Maternal Aunt      Current Outpatient Medications:    benzonatate  (TESSALON  PERLES) 100 MG capsule, Take 1 capsule (100 mg total) by mouth 3 (three) times daily as needed., Disp: 30 capsule, Rfl: 0   cyclobenzaprine  (FLEXERIL ) 5 MG tablet, One tab po every pm prn, Disp: 30 tablet, Rfl: 0   gabapentin  (NEURONTIN ) 300 MG capsule, Take 1 capsule (300 mg total) by mouth 2 (two) times daily., Disp: 180 capsule, Rfl: 1   predniSONE  (DELTASONE ) 5 MG tablet, Take all meds as directed., Disp: 21 tablet, Rfl: 0   rosuvastatin  (CRESTOR ) 5 MG tablet, Take 1 tablet (5 mg total) by mouth daily., Disp: 30 tablet, Rfl: 11   Vitamin D , Ergocalciferol , (DRISDOL ) 1.25 MG (50000 UNIT) CAPS capsule, TAKE 1 CAPSULE EVERY WEEK ON TUESDAYS AND THURSDAYS., Disp: 24 capsule, Rfl: 0   No Known Allergies   Review of Systems  Constitutional: Negative.   Respiratory: Negative.    Cardiovascular: Negative.   Gastrointestinal: Negative.   Musculoskeletal:  Positive for arthralgias.  Neurological:  Positive for numbness.  Psychiatric/Behavioral:  Negative.       Today's Vitals   06/11/23 1407  BP: 112/84  Pulse: 87  Temp: 98.7 F (37.1 C)  SpO2: 98%  Weight: 172 lb 12.8 oz (78.4 kg)  Height: 5' 1 (1.549 m)   Body mass index is 32.65 kg/m.  Wt Readings from Last 3 Encounters:  06/11/23 172 lb 12.8 oz (78.4 kg)  04/02/23 171 lb (77.6 kg)  01/15/23 166 lb (75.3 kg)     Objective:  Physical Exam Vitals and nursing note reviewed.  Constitutional:      Appearance: Normal appearance.  HENT:     Head: Normocephalic and atraumatic.   Eyes:     Extraocular  Movements: Extraocular movements intact.    Cardiovascular:     Rate and Rhythm: Normal rate and regular rhythm.     Heart sounds: Normal heart sounds.  Pulmonary:     Effort: Pulmonary effort is normal.     Breath sounds: Normal breath sounds.   Musculoskeletal:     Cervical back: Normal range of motion.   Skin:    General: Skin is warm.   Neurological:     General: No focal deficit present.     Mental Status: She is alert.   Psychiatric:        Mood and Affect: Mood normal.        Behavior: Behavior normal.      Assessment And Plan:  Meralgia paresthetica of right side Assessment & Plan: Chronic burning sensation on the lateral thigh, likely due to a pinched nerve in the groin. Gabapentin  may require adjustment for better symptom control. - Adjust gabapentin  dosage to one in the morning and two at night. - Check for samples of lidocaine  patch for symptomatic relief. - Discuss the use of Salonpas patches as an over-the-counter option.   Pure hypercholesterolemia Assessment & Plan: Chronic, On cholesterol medication with elevated lipoprotein(a), indicating a genetic predisposition to heart disease. - Recheck cholesterol levels. - LDL goal is less than 70 - Continue with statin therapy  Orders: -     Lipid panel -     ALT  Chronic pain of left knee Assessment & Plan: Chronic knee pain with swelling, previously treated with arthroscopic surgery. Referral to a local orthopedic specialist is considered for further management. - Recommend using Voltaren  gel on the knee for pain relief. - Consider referral to an orthopedic specialist in [facility] for further evaluation and management.   Abnormal glucose Assessment & Plan: Previous labs reviewed, her A1c has been elevated in the past. I will check an A1c today. Reminded to avoid refined sugars including sugary drinks/foods and processed meats including bacon, sausages and deli meats.    Orders: -     Hemoglobin  A1c  Vitamin D  deficiency disease Assessment & Plan: I WILL CHECK A VIT D LEVEL AND SUPPLEMENT AS NEEDED.  ALSO ENCOURAGED TO SPEND 15 MINUTES IN THE SUN DAILY. - Advise switching to 5000 IU over-the-counter vitamin D  daily once stable.  Orders: -     VITAMIN D  25 Hydroxy (Vit-D Deficiency, Fractures)   Return in 6 months (on 12/11/2023) for physical.  Patient was given opportunity to ask questions. Patient verbalized understanding of the plan and was able to repeat key elements of the plan. All questions were answered to their satisfaction.   I, Smiley Dung, MD, have reviewed all documentation for this visit. The documentation on 06/11/23 for the exam, diagnosis, procedures, and orders are all accurate and complete.  IF YOU HAVE BEEN REFERRED TO A SPECIALIST, IT MAY TAKE 1-2 WEEKS TO SCHEDULE/PROCESS THE REFERRAL. IF YOU HAVE NOT HEARD FROM US /SPECIALIST IN TWO WEEKS, PLEASE GIVE US  A CALL AT [phone].   THE PATIENT IS ENCOURAGED TO PRACTICE SOCIAL DISTANCING DUE TO THE COVID-19 PANDEMIC.

## 2023-06-12 ENCOUNTER — Other Ambulatory Visit: Payer: Self-pay

## 2023-06-12 LAB — HEMOGLOBIN A1C
Est. average glucose Bld gHb Est-mCnc: 120 mg/dL
Hgb A1c MFr Bld: 5.8 % — ABNORMAL HIGH (ref 4.8–5.6)

## 2023-06-12 LAB — VITAMIN D 25 HYDROXY (VIT D DEFICIENCY, FRACTURES): Vit D, 25-Hydroxy: 120 ng/mL — ABNORMAL HIGH (ref 30.0–100.0)

## 2023-06-12 LAB — LIPID PANEL
Chol/HDL Ratio: 2.8 ratio (ref 0.0–4.4)
Cholesterol, Total: 176 mg/dL (ref 100–199)
HDL: 63 mg/dL (ref >39)
LDL Chol Calc (NIH): 87 mg/dL (ref 0–99)
Triglycerides: 155 mg/dL — ABNORMAL HIGH (ref 0–149)
VLDL Cholesterol Cal: 26 mg/dL (ref 5–40)

## 2023-06-12 LAB — ALT: ALT: 14 IU/L (ref 0–32)

## 2023-06-12 MED ORDER — VITAMIN D (ERGOCALCIFEROL) 1.25 MG (50000 UNIT) PO CAPS: ORAL_CAPSULE | ORAL | 0 refills | Status: DC

## 2023-06-16 ENCOUNTER — Ambulatory Visit: Payer: Self-pay | Admitting: Internal Medicine

## 2023-06-22 DIAGNOSIS — E559 Vitamin D deficiency, unspecified: Secondary | ICD-10-CM | POA: Insufficient documentation

## 2023-06-22 DIAGNOSIS — G8929 Other chronic pain: Secondary | ICD-10-CM | POA: Insufficient documentation

## 2023-06-22 DIAGNOSIS — G5711 Meralgia paresthetica, right lower limb: Secondary | ICD-10-CM | POA: Insufficient documentation

## 2023-06-22 NOTE — Assessment & Plan Note (Signed)
 Previous labs reviewed, her A1c has been elevated in the past. I will check an A1c today. Reminded to avoid refined sugars including sugary drinks/foods and processed meats including bacon, sausages and deli meats.

## 2023-06-22 NOTE — Assessment & Plan Note (Signed)
 Chronic burning sensation on the lateral thigh, likely due to a pinched nerve in the groin. Gabapentin  may require adjustment for better symptom control. - Adjust gabapentin  dosage to one in the morning and two at night. - Check for samples of lidocaine  patch for symptomatic relief. - Discuss the use of Salonpas patches as an over-the-counter option.

## 2023-06-22 NOTE — Assessment & Plan Note (Signed)
 Chronic knee pain with swelling, previously treated with arthroscopic surgery. Referral to a local orthopedic specialist is considered for further management. - Recommend using Voltaren  gel on the knee for pain relief. - Consider referral to an orthopedic specialist in [facility] for further evaluation and management.

## 2023-06-22 NOTE — Assessment & Plan Note (Signed)
 Chronic, On cholesterol medication with elevated lipoprotein(a), indicating a genetic predisposition to heart disease. - Recheck cholesterol levels. - LDL goal is less than 70 - Continue with statin therapy

## 2023-06-22 NOTE — Assessment & Plan Note (Addendum)
 I WILL CHECK A VIT D LEVEL AND SUPPLEMENT AS NEEDED.  ALSO ENCOURAGED TO SPEND 15 MINUTES IN THE SUN DAILY. - Advise switching to 5000 IU over-the-counter vitamin D  daily once stable.

## 2023-09-17 ENCOUNTER — Ambulatory Visit (INDEPENDENT_AMBULATORY_CARE_PROVIDER_SITE_OTHER): Payer: Federal, State, Local not specified - PPO

## 2023-09-17 VITALS — BP 114/68 | HR 65 | Temp 98.4°F | Ht 61.0 in | Wt 172.6 lb

## 2023-09-17 DIAGNOSIS — Z0000 Encounter for general adult medical examination without abnormal findings: Secondary | ICD-10-CM

## 2023-09-17 NOTE — Patient Instructions (Signed)
 [name],  Thank you for taking the time for your Medicare Wellness Visit. I appreciate your continued commitment to your health goals. Please review the care plan we discussed, and feel free to reach out if I can assist you further.  Medicare recommends these wellness visits once per year to help you and your care team stay ahead of potential health issues. These visits are designed to focus on prevention, allowing your provider to concentrate on managing your acute and chronic conditions during your regular appointments.  Please note that Annual Wellness Visits do not include a physical exam. Some assessments may be limited, especially if the visit was conducted virtually. If needed, we may recommend a separate in-person follow-up with your provider.  Ongoing Care Seeing your primary care provider every 3 to 6 months helps us  monitor your health and provide consistent, personalized care.   Referrals If a referral was made during today's visit and you haven't received any updates within two weeks, please contact the referred provider directly to check on the status.  Recommended Screenings:  Health Maintenance  Topic Date Due   Zoster (Shingles) Vaccine (1 of 2) Never done   Flu Shot  Never done   COVID-19 Vaccine (4 - 2025-26 season) 09/08/2023   Pneumococcal Vaccine for age over 24 (1 of 1 - PCV) 06/10/2024*   Medicare Annual Wellness Visit  09/16/2024   Mammogram  12/24/2024   Colon Cancer Screening  11/28/2029   DEXA scan (bone density measurement)  Completed   Hepatitis C Screening  Completed   HPV Vaccine  Aged Out   Meningitis B Vaccine  Aged Out   DTaP/Tdap/Td vaccine  Discontinued  *Topic was postponed. The date shown is not the original due date.       09/17/2023   10:27 AM  Advanced Directives  Does Patient Have a Medical Advance Directive? No  Would patient like information on creating a medical advance directive? No - Patient declined   Advance Care Planning is  important because it: Ensures you receive medical care that aligns with your values, goals, and preferences. Provides guidance to your family and loved ones, reducing the emotional burden of decision-making during critical moments.  Vision: Annual vision screenings are recommended for early detection of glaucoma, cataracts, and diabetic retinopathy. These exams can also reveal signs of chronic conditions such as diabetes and high blood pressure.  Dental: Annual dental screenings help detect early signs of oral cancer, gum disease, and other conditions linked to overall health, including heart disease and diabetes.  Please see the attached documents for additional preventive care recommendations.

## 2023-09-17 NOTE — Progress Notes (Signed)
 Subjective:   TENELLE ANDREASON is a 68 y.o. who presents for a Medicare Wellness preventive visit.  As a reminder, Annual Wellness Visits don't include a physical exam, and some assessments may be limited, especially if this visit is performed virtually. We may recommend an in-person follow-up visit with your provider if needed.  Visit Complete: In person    Persons Participating in Visit: Patient.  AWV Questionnaire: No: Patient Medicare AWV questionnaire was not completed prior to this visit.  Cardiac Risk Factors include: advanced age (>58men, >38 women);dyslipidemia;obesity (BMI >30kg/m2)     Objective:    Today's Vitals   09/17/23 1021  BP: 114/68  Pulse: 65  Temp: 98.4 F (36.9 C)  TempSrc: Oral  SpO2: 98%  Weight: 172 lb 9.6 oz (78.3 kg)  Height: 5' 1 (1.549 m)   Body mass index is 32.61 kg/m.     09/17/2023   10:27 AM 09/11/2022    9:30 AM 05/17/2022    4:43 PM 07/18/2021    9:31 AM 05/10/2021    9:35 AM 01/02/2014    5:53 PM  Advanced Directives  Does Patient Have a Medical Advance Directive? No No No No No No   Would patient like information on creating a medical advance directive? No - Patient declined   No - Patient declined No - Patient declined No - patient declined information      Data saved with a previous flowsheet row definition    Current Medications (verified) Outpatient Encounter Medications as of 09/17/2023  Medication Sig   benzonatate  (TESSALON  PERLES) 100 MG capsule Take 1 capsule (100 mg total) by mouth 3 (three) times daily as needed.   gabapentin  (NEURONTIN ) 300 MG capsule Take 1 capsule (300 mg total) by mouth 2 (two) times daily.   Vitamin D , Ergocalciferol , (DRISDOL ) 1.25 MG (50000 UNIT) CAPS capsule TAKE 1 CAPSULE EVERY WEEK ON TUESDAYS AND THURSDAYS.   cyclobenzaprine  (FLEXERIL ) 5 MG tablet One tab po every pm prn (Patient not taking: Reported on 09/17/2023)   predniSONE  (DELTASONE ) 5 MG tablet Take all meds as directed. (Patient  not taking: Reported on 09/17/2023)   rosuvastatin  (CRESTOR ) 5 MG tablet Take 1 tablet (5 mg total) by mouth daily.   No facility-administered encounter medications on file as of 09/17/2023.    Allergies (verified) Patient has no known allergies.   History: Past Medical History:  Diagnosis Date   Back pain    Hyperlipidemia    Prediabetes    Vitamin D  deficiency    Past Surgical History:  Procedure Laterality Date   CESAREAN SECTION     HAND SURGERY Bilateral 2016   KNEE ARTHROSCOPY Left 11/21/2021   In [place]   TRIGGER FINGER RELEASE Left 10/04/2017   Dr. Glendia Hutchinson   TRIGGER FINGER RELEASE Right 05/06/2018   right ring finger   Family History  Problem Relation Age of Onset   Dementia Mother    Cancer Father    Leukemia Father    Cancer Maternal Aunt    Social History   Socioeconomic History   Marital status: Single    Spouse name: Not on file   Number of children: Not on file   Years of education: Not on file   Highest education level: Not on file  Occupational History   Occupation: [employer]  Tobacco Use   Smoking status: Never   Smokeless tobacco: Never  Vaping Use   Vaping status: Never Used  Substance and Sexual Activity   Alcohol use: No  Drug use: No   Sexual activity: Not on file  Other Topics Concern   Not on file  Social History Narrative   Not on file   Social Drivers of Health   Financial Resource Strain: Low Risk  (09/17/2023)   Overall Financial Resource Strain (CARDIA)    Difficulty of Paying Living Expenses: Not hard at all  Food Insecurity: No Food Insecurity (09/17/2023)   Hunger Vital Sign    Worried About Running Out of Food in the Last Year: Never true    Ran Out of Food in the Last Year: Never true  Transportation Needs: No Transportation Needs (09/17/2023)   PRAPARE - Administrator, Civil Service (Medical): No    Lack of Transportation (Non-Medical): No  Physical Activity: Sufficiently Active (09/17/2023)   Exercise  Vital Sign    Days of Exercise per Week: 3 days    Minutes of Exercise per Session: 60 min  Stress: No Stress Concern Present (09/17/2023)   [employer] of Occupational Health - Occupational Stress Questionnaire    Feeling of Stress: Not at all  Social Connections: Moderately Isolated (09/17/2023)   Social Connection and Isolation Panel    Frequency of Communication with Friends and Family: More than three times a week    Frequency of Social Gatherings with Friends and Family: More than three times a week    Attends Religious Services: More than 4 times per year    Active Member of [employer] or Organizations: No    Attends Engineer, structural: Never    Marital Status: Never married    Tobacco Counseling Counseling given: Not Answered    Clinical Intake:  Pre-visit preparation completed: Yes  Pain : No/denies pain     Nutritional Status: BMI > 30  Obese Nutritional Risks: None Diabetes: No  Lab Results  Component Value Date   HGBA1C 5.8 (H) 06/11/2023   HGBA1C 5.6 09/19/2022   HGBA1C 5.6 02/28/2020     How often do you need to have someone help you when you read instructions, pamphlets, or other written materials from your doctor or pharmacy?: 1 - Never  Interpreter Needed?: No  Information entered by :: NAllen LPN   Activities of Daily Living     09/17/2023   10:22 AM  In your present state of health, do you have any difficulty performing the following activities:  Hearing? 0  Vision? 0  Difficulty concentrating or making decisions? 0  Walking or climbing stairs? 0  Dressing or bathing? 0  Doing errands, shopping? 0  Preparing Food and eating ? N  Using the Toilet? N  In the past six months, have you accidently leaked urine? N  Do you have problems with loss of bowel control? N  Managing your Medications? N  Managing your Finances? N  Housekeeping or managing your Housekeeping? N    Patient Care Team: Jarold Medici, MD as PCP - General  (Internal Medicine)  I have updated your Care Teams any recent Medical Services you may have received from other providers in the past year.     Assessment:   This is a routine wellness examination for Rosha.  Hearing/Vision screen Hearing Screening - Comments:: Denies hearing issues Vision Screening - Comments:: Regular eye exams, [facility]   Goals Addressed             This Visit's Progress    Patient Stated       09/17/2023, wants to lose 15-20 pounds  Depression Screen     09/17/2023   10:28 AM 06/11/2023    2:08 PM 09/19/2022    8:30 AM 09/11/2022    9:31 AM 05/30/2022    2:52 PM 03/19/2022    8:41 AM 10/29/2021   10:21 AM  PHQ 2/9 Scores  PHQ - 2 Score 0 0 0 0 0 0 0  PHQ- 9 Score  0 0 0       Fall Risk     09/17/2023   10:27 AM 06/11/2023    2:07 PM 09/19/2022    8:29 AM 09/11/2022    9:31 AM 05/30/2022    2:52 PM  Fall Risk   Falls in the past year? 0 0 0 0 0  Number falls in past yr: 0 0 0 0 0  Injury with Fall? 0 0 0 0 0  Risk for fall due to : Medication side effect No Fall Risks No Fall Risks Medication side effect No Fall Risks  Follow up Falls evaluation completed;Falls prevention discussed Falls evaluation completed Falls evaluation completed Falls prevention discussed;Falls evaluation completed Falls evaluation completed    MEDICARE RISK AT HOME:  Medicare Risk at Home Any stairs in or around the home?: No If so, are there any without handrails?: No Home free of loose throw rugs in walkways, pet beds, electrical cords, etc?: Yes Adequate lighting in your home to reduce risk of falls?: Yes Life alert?: No Use of a cane, walker or w/c?: No Grab bars in the bathroom?: Yes Shower chair or bench in shower?: Yes Elevated toilet seat or a handicapped toilet?: No  TIMED UP AND GO:  Was the test performed?  Yes  Length of time to ambulate 10 feet: 5 sec Gait steady and fast without use of assistive device  Cognitive Function: 6CIT completed         09/17/2023   10:28 AM 09/11/2022    9:32 AM  6CIT Screen  What Year? 0 points 0 points  What month? 0 points 0 points  What time? 0 points 0 points  Count back from 20 0 points 0 points  Months in reverse 0 points 0 points  Repeat phrase 6 points 4 points  Total Score 6 points 4 points    Immunizations Immunization History  Administered Date(s) Administered   PFIZER(Purple Top)SARS-COV-2 Vaccination 03/16/2019, 04/13/2019, 12/28/2019   PPD Test 01/13/2018    Screening Tests Health Maintenance  Topic Date Due   Zoster Vaccines- Shingrix (1 of 2) Never done   Influenza Vaccine  Never done   COVID-19 Vaccine (4 - 2025-26 season) 09/08/2023   Pneumococcal Vaccine: 50+ Years (1 of 1 - PCV) 06/10/2024 (Originally 03/14/2004)   Medicare Annual Wellness (AWV)  09/16/2024   MAMMOGRAM  12/24/2024   Colonoscopy  11/28/2029   DEXA SCAN  Completed   Hepatitis C Screening  Completed   HPV VACCINES  Aged Out   Meningococcal B Vaccine  Aged Out   DTaP/Tdap/Td  Discontinued    Health Maintenance Items Addressed: Declines vaccines at this time.  Additional Screening:  Vision Screening: Recommended annual ophthalmology exams for early detection of glaucoma and other disorders of the eye. Is the patient up to date with their annual eye exam?  Yes  Who is the provider or what is the name of the office in which the patient attends annual eye exams? [facility]  Dental Screening: Recommended annual dental exams for proper oral hygiene  Community Resource Referral / Chronic Care Management: CRR required this  visit?  No   CCM required this visit?  No   Plan:    I have personally reviewed and noted the following in the patient's chart:   Medical and social history Use of alcohol, tobacco or illicit drugs  Current medications and supplements including opioid prescriptions. Patient is not currently taking opioid prescriptions. Functional ability and status Nutritional status Physical  activity Advanced directives List of other physicians Hospitalizations, surgeries, and ER visits in previous 12 months Vitals Screenings to include cognitive, depression, and falls Referrals and appointments  In addition, I have reviewed and discussed with patient certain preventive protocols, quality metrics, and best practice recommendations. A written personalized care plan for preventive services as well as general preventive health recommendations were provided to patient.   Ardella FORBES Dawn, LPN   [id number]   After Visit Summary: (In Person-Printed) AVS printed and given to the patient  Notes: Nothing significant to report at this time.

## 2023-09-29 ENCOUNTER — Other Ambulatory Visit: Payer: Self-pay | Admitting: Internal Medicine

## 2023-10-20 ENCOUNTER — Ambulatory Visit: Payer: Self-pay | Admitting: Family Medicine

## 2023-10-22 ENCOUNTER — Ambulatory Visit: Payer: Self-pay | Admitting: Family Medicine

## 2023-10-29 ENCOUNTER — Encounter: Payer: Self-pay | Admitting: Internal Medicine

## 2023-10-29 ENCOUNTER — Ambulatory Visit: Payer: Self-pay | Admitting: Internal Medicine

## 2023-10-29 VITALS — BP 122/78 | HR 83 | Temp 98.4°F | Ht 61.0 in | Wt 175.8 lb

## 2023-10-29 DIAGNOSIS — I2510 Atherosclerotic heart disease of native coronary artery without angina pectoris: Secondary | ICD-10-CM | POA: Diagnosis not present

## 2023-10-29 DIAGNOSIS — M1712 Unilateral primary osteoarthritis, left knee: Secondary | ICD-10-CM

## 2023-10-29 DIAGNOSIS — Z01818 Encounter for other preprocedural examination: Secondary | ICD-10-CM | POA: Diagnosis not present

## 2023-10-29 DIAGNOSIS — Z79899 Other long term (current) drug therapy: Secondary | ICD-10-CM

## 2023-10-29 DIAGNOSIS — E7800 Pure hypercholesterolemia, unspecified: Secondary | ICD-10-CM | POA: Diagnosis not present

## 2023-10-29 DIAGNOSIS — Z5181 Encounter for therapeutic drug level monitoring: Secondary | ICD-10-CM

## 2023-10-29 DIAGNOSIS — Z131 Encounter for screening for diabetes mellitus: Secondary | ICD-10-CM

## 2023-10-29 MED ORDER — ASPIRIN 81 MG PO TBEC: 81.0000 mg | DELAYED_RELEASE_TABLET | Freq: Every day | ORAL | Status: AC

## 2023-10-29 NOTE — Progress Notes (Signed)
 Subjective:  Patient ID: Krista Miles , female    DOB: [birth year] , 68 y.o.   MRN: [id number]  Chief Complaint  Patient presents with   Pre-op Exam    Patient presents today for surgical clearance. She has an upcoming left knee replacement.  She reports compliance with medications. Denies headache, chest pain & sob.    Discussed the use of AI scribe software for clinical note transcription with the patient, who gave verbal consent to proceed.  History of Present Illness Krista Miles is a 68 year old female who presents for a preoperative evaluation for knee replacement surgery.  She is scheduled for knee replacement surgery on November 05, 2023, in [place] due to worsening left knee condition described as 'bone on bone' following previous arthroscopic surgery. She experiences increased pain with attempts to strengthen the knee, impacting her ability to perform daily activities. Despite the pain, she manages to walk for about 30 minutes by doing laps inside a house with a friend. No recent falls, chest pain, or shortness of breath with walking. She is able to exercise, although not without discomfort. She does not have stairs in her home or in the home where she exercises.  Her past medical history includes a cardiac score of 2.29, with calcification in the heart artery and plaque in the aorta. She is currently taking rosuvastatin  for cholesterol management. No recent cardiac issues, palpitations, or irregular heartbeats. She is not currently taking aspirin. She has a history of being cautious with needles and injections.    Past Medical History:  Diagnosis Date   Back pain    Hyperlipidemia    Prediabetes    Vitamin D  deficiency      Family History  Problem Relation Age of Onset   Dementia Mother    Cancer Father    Leukemia Father    Cancer Maternal Aunt      Current Outpatient Medications:    aspirin EC 81 MG tablet, Take 1 tablet (81 mg total) by mouth daily.  Swallow whole., Disp: , Rfl:    benzonatate  (TESSALON  PERLES) 100 MG capsule, Take 1 capsule (100 mg total) by mouth 3 (three) times daily as needed., Disp: 30 capsule, Rfl: 0   gabapentin  (NEURONTIN ) 300 MG capsule, Take 1 capsule (300 mg total) by mouth 2 (two) times daily., Disp: 180 capsule, Rfl: 1   rosuvastatin  (CRESTOR ) 5 MG tablet, Take 1 tablet (5 mg total) by mouth daily., Disp: 30 tablet, Rfl: 11   Vitamin D , Ergocalciferol , (DRISDOL ) 1.25 MG (50000 UNIT) CAPS capsule, TAKE ONE CAPSULE BY MOUTH EVERY WEEK ON TUESDAYS AND THURSDAYS, Disp: 24 capsule, Rfl: 0   cyclobenzaprine  (FLEXERIL ) 5 MG tablet, One tab po every pm prn (Patient not taking: Reported on 10/29/2023), Disp: 30 tablet, Rfl: 0   predniSONE  (DELTASONE ) 5 MG tablet, Take all meds as directed. (Patient not taking: Reported on 10/29/2023), Disp: 21 tablet, Rfl: 0   No Known Allergies   Review of Systems  Constitutional: Negative.   Respiratory: Negative.    Cardiovascular: Negative.   Neurological: Negative.   Psychiatric/Behavioral: Negative.       Today's Vitals   10/29/23 1044  BP: 122/78  Pulse: 83  Temp: 98.4 F (36.9 C)  SpO2: 98%  Weight: 175 lb 12.8 oz (79.7 kg)  Height: 5' 1 (1.549 m)   Body mass index is 33.22 kg/m.  Wt Readings from Last 3 Encounters:  10/29/23 175 lb 12.8 oz (79.7 kg)  09/17/23  172 lb 9.6 oz (78.3 kg)  06/11/23 172 lb 12.8 oz (78.4 kg)    The 10-year ASCVD risk score (Arnett DK, et al., 2019) is: 7.1%   Values used to calculate the score:     Age: 84 years     Clincally relevant sex: Female     Is Non-Hispanic African American: No     Diabetic: No     Tobacco smoker: No     Systolic Blood Pressure: 122 mmHg     Is BP treated: No     HDL Cholesterol: 74 mg/dL     Total Cholesterol: 190 mg/dL  Objective:  Physical Exam Vitals and nursing note reviewed.  Constitutional:      Appearance: Normal appearance.  HENT:     Head: Normocephalic and atraumatic.  Eyes:      Extraocular Movements: Extraocular movements intact.  Cardiovascular:     Rate and Rhythm: Normal rate and regular rhythm.     Heart sounds: Normal heart sounds.  Pulmonary:     Effort: Pulmonary effort is normal.     Breath sounds: Normal breath sounds.  Musculoskeletal:     Cervical back: Normal range of motion.  Skin:    General: Skin is warm.  Neurological:     General: No focal deficit present.     Mental Status: She is alert.  Psychiatric:        Mood and Affect: Mood normal.        Behavior: Behavior normal.         Assessment And Plan:  Primary osteoarthritis of left knee Assessment & Plan: Primary osteoarthritis with bone-on-bone contact, increasing pain, and limited mobility. Scheduled for knee replacement surgery on November 05, 2023. - Order preoperative labs as requested by surgical team. - Complete all preoperative evaluations before surgery. - She is able to walk up steps without any cp/sob. She is able to complete all ADLs without difficulty (outside of knee pain).  - She may proceed with surgery with acceptable risk.    Pure hypercholesterolemia -     CMP14+EGFR -     Lipid panel  Atherosclerosis of native coronary artery of native heart without angina pectoris Assessment & Plan: Atherosclerotic cardiovascular disease with coronary and aortic plaque. Managed with rosuvastatin . - LDL goal is less than 70  Orders: -     Protime-INR  Pre-op evaluation Assessment & Plan: EKG performed, NSR w/ low voltage.   Orders: -     EKG 12-Lead -     C-reactive protein -     Sedimentation rate  Polypharmacy -     CBC -     CMP14+EGFR  Screening for diabetes mellitus -     Hemoglobin A1c  Encounter for therapeutic drug monitoring -     APTT  Other orders -     Aspirin; Take 1 tablet (81 mg total) by mouth daily. Swallow whole. -     Sedimentation rate    Return if symptoms worsen or fail to improve.  Patient was given opportunity to ask questions.  Patient verbalized understanding of the plan and was able to repeat key elements of the plan. All questions were answered to their satisfaction.    I, Krista LOISE Slocumb, MD, have reviewed all documentation for this visit. The documentation on 10/29/23 for the exam, diagnosis, procedures, and orders are all accurate and complete.   IF YOU HAVE BEEN REFERRED TO A SPECIALIST, IT MAY TAKE 1-2 WEEKS TO SCHEDULE/PROCESS THE REFERRAL. IF  YOU HAVE NOT HEARD FROM US /SPECIALIST IN TWO WEEKS, PLEASE GIVE US  A CALL AT [phone].

## 2023-10-30 LAB — CBC
Hematocrit: 38.9 % (ref 34.0–46.6)
Hemoglobin: 12.8 g/dL (ref 11.1–15.9)
MCH: 30.5 pg (ref 26.6–33.0)
MCHC: 32.9 g/dL (ref 31.5–35.7)
MCV: 93 fL (ref 79–97)
Platelets: 232 x10E3/uL (ref 150–450)
RBC: 4.19 x10E6/uL (ref 3.77–5.28)
RDW: 12.3 % (ref 11.7–15.4)
WBC: 6.3 x10E3/uL (ref 3.4–10.8)

## 2023-10-30 LAB — CMP14+EGFR
ALT: 20 IU/L (ref 0–32)
AST: 24 IU/L (ref 0–40)
Albumin: 4.5 g/dL (ref 3.9–4.9)
Alkaline Phosphatase: 80 IU/L (ref 49–135)
BUN/Creatinine Ratio: 19 (ref 12–28)
BUN: 15 mg/dL (ref 8–27)
Bilirubin Total: 0.4 mg/dL (ref 0.0–1.2)
CO2: 25 mmol/L (ref 20–29)
Calcium: 9.8 mg/dL (ref 8.7–10.3)
Chloride: 100 mmol/L (ref 96–106)
Creatinine, Ser: 0.8 mg/dL (ref 0.57–1.00)
Globulin, Total: 3 g/dL (ref 1.5–4.5)
Glucose: 94 mg/dL (ref 70–99)
Potassium: 4.5 mmol/L (ref 3.5–5.2)
Sodium: 138 mmol/L (ref 134–144)
Total Protein: 7.5 g/dL (ref 6.0–8.5)
eGFR: 80 mL/min/1.73 (ref >59)

## 2023-10-30 LAB — HEMOGLOBIN A1C
Est. average glucose Bld gHb Est-mCnc: 114 mg/dL
Hgb A1c MFr Bld: 5.6 % (ref 4.8–5.6)

## 2023-10-30 LAB — SEDIMENTATION RATE
Sed Rate: 37 mm/h (ref 0–40)
Sed Rate: 31 mm/h (ref 0–40)

## 2023-10-30 LAB — LIPID PANEL
Chol/HDL Ratio: 2.6 ratio (ref 0.0–4.4)
Cholesterol, Total: 190 mg/dL (ref 100–199)
HDL: 74 mg/dL (ref >39)
LDL Chol Calc (NIH): 100 mg/dL — ABNORMAL HIGH (ref 0–99)
Triglycerides: 87 mg/dL (ref 0–149)
VLDL Cholesterol Cal: 16 mg/dL (ref 5–40)

## 2023-10-30 LAB — PROTIME-INR
INR: 0.9 (ref 0.9–1.2)
Prothrombin Time: 10.2 s (ref 9.1–12.0)

## 2023-10-30 LAB — APTT: aPTT: 25 s (ref 24–33)

## 2023-10-30 LAB — C-REACTIVE PROTEIN: CRP: 3 mg/L (ref 0–10)

## 2023-10-31 ENCOUNTER — Ambulatory Visit: Payer: Self-pay | Admitting: Internal Medicine

## 2023-11-02 DIAGNOSIS — I2510 Atherosclerotic heart disease of native coronary artery without angina pectoris: Secondary | ICD-10-CM | POA: Insufficient documentation

## 2023-11-02 DIAGNOSIS — M1712 Unilateral primary osteoarthritis, left knee: Secondary | ICD-10-CM | POA: Insufficient documentation

## 2023-11-02 NOTE — Assessment & Plan Note (Signed)
 EKG performed, NSR w/ low voltage.

## 2023-11-02 NOTE — Assessment & Plan Note (Signed)
 Primary osteoarthritis with bone-on-bone contact, increasing pain, and limited mobility. Scheduled for knee replacement surgery on November 05, 2023. - Order preoperative labs as requested by surgical team. - Complete all preoperative evaluations before surgery. - She is able to walk up steps without any cp/sob. She is able to complete all ADLs without difficulty (outside of knee pain).  - She may proceed with surgery with acceptable risk.

## 2023-11-02 NOTE — Assessment & Plan Note (Signed)
 Atherosclerotic cardiovascular disease with coronary and aortic plaque. Managed with rosuvastatin . - LDL goal is less than 70

## 2023-11-02 NOTE — Patient Instructions (Signed)
 Preventing Problems After Surgery Having surgery can put you at risk for complications such as pneumonia or blood clots. There are things you can do to prevent complications as you recover in the hospital. Follow instructions from your health care team after surgery to ensure a successful recovery. Talk with your health care provider if you have questions or concerns. Follow these instructions after surgery: General instructions  Wash your hands regularly with soap and water for at least 20 seconds. Ask for help if you cannot reach the sink or if you need help walking to the bathroom. Anyone who enters your hospital room should wash his or her hands. This includes all hospital staff and visitors. Handwashing is the best action to prevent infection. When you are in bed, keep the head of the bed raised 30 degrees or more, if your health care provider approves. This means that your head should be higher than your body. Raising the head of your bed helps you breathe more easily and clear mucus out of your lungs. This can help prevent pneumonia. Activity Take short walks several times a day, or as recommended by your health care provider. Walking helps you recover. It also helps improve breathing and prevent blood clots. Ask for help if you feel weak or unsteady. Get out of bed and sit in a chair, if you are able. Sitting upright helps your lungs to expand and fill with air. Pain management Work with your health care team to make a pain management plan. If you are comfortable and not in pain, you are more likely to: Get out of bed and walk. Sleep better. Take deep breaths. Heal faster. Let your health care provider know if your pain is severe or interferes with sleep and activity. Coughing and deep breathing Take deep breaths and cough every 2 hours while you are awake. This helps to expand your lungs and get rid of mucus. To do this: Sit on the edge of a bed, or sit up as far as you can in a bed or a  chair. Take in a slow, deep breath through your nose. Hold the breath for a few seconds. Slowly let the breath out through your mouth, like you are blowing out a candle. Take 3-5 deep breaths as described in steps 2-4. Hold in the last breath for a few seconds. Cough deeply 2-3 times. Push the air out of your lungs as you cough. If you have an incision on your chest or abdomen, hold a pillow or your hands against your incision as you cough. Using an incentive spirometer     If you were given an incentive spirometer, use it every 1-2 hours while you are awake or as recommended by your health care provider. This device measures how well you are filling your lungs with each breath. Regular use of the device can: Help you practice slow, deep breathing. Gradually help to expand your lungs more. Help prevent complications after surgery. To use the device: Sit on the edge of your bed if possible, or sit up as far as you can in bed or a chair. Hold the device in an upright position. Breathe out normally. Place the mouthpiece in your mouth, and seal your lips tightly around it. Breathe in slowly and as deeply as possible, raising the piston or the ball toward the top of the column. Hold your breath for 3-5 seconds or for as long as possible. Allow the piston or ball to fall to the bottom of the  column. Remove the mouthpiece from your mouth and breathe out normally. If the device has an indicator to show your best effort, use the indicator as a goal to work toward during each repetition. Rest for a few seconds. Repeat this process 10 or more times. Take your time, and take a few normal breaths between deep breaths. Breathing too quickly may make you feel dizzy or cause you to pass out. Practice coughing to be sure your lungs are clear. If you have an incision on your chest or abdomen, hold a pillow or your hands against your incision as you cough. Mouth care  Keep your mouth clean to prevent  infection. Follow these steps two times a day: Brush and floss your teeth. Floss between all teeth. Ask for help if you need it. Rinse your mouth with water after brushing. Rinse your mouth with a mouthwash, and then spit out the mouthwash. If you have dentures: Clean your dentures two times a day. Use a soft-bristle brush to brush your gums, tongue, and mouth. Summary Having surgery can put you at risk for complications such as pneumonia or blood clots. There are things you can do to prevent complications and to help ensure a successful recovery. Take short walks several times a day. Walking helps you recover. It also helps improve breathing and prevent blood clots. Regularly take deep breaths and cough, and use an incentive spirometer as told by your health care provider. These actions help prevent pneumonia. This information is not intended to replace advice given to you by your health care provider. Make sure you discuss any questions you have with your health care provider. Document Revised: 03/06/2020 Document Reviewed: 03/06/2020 [employer] Patient Education  2024 [employer].

## 2023-12-24 ENCOUNTER — Other Ambulatory Visit: Payer: Self-pay | Admitting: Internal Medicine

## 2023-12-24 ENCOUNTER — Other Ambulatory Visit: Payer: Self-pay | Admitting: Family Medicine

## 2023-12-24 ENCOUNTER — Encounter: Payer: Self-pay | Admitting: Internal Medicine

## 2023-12-24 DIAGNOSIS — E7800 Pure hypercholesterolemia, unspecified: Secondary | ICD-10-CM

## 2023-12-26 ENCOUNTER — Other Ambulatory Visit: Payer: Self-pay | Admitting: Internal Medicine

## 2023-12-26 DIAGNOSIS — Z1231 Encounter for screening mammogram for malignant neoplasm of breast: Secondary | ICD-10-CM

## 2023-12-29 ENCOUNTER — Other Ambulatory Visit: Payer: Self-pay

## 2023-12-29 MED ORDER — GABAPENTIN 300 MG PO CAPS: 300.0000 mg | ORAL_CAPSULE | Freq: Two times a day (BID) | ORAL | 1 refills | Status: AC

## 2024-01-04 ENCOUNTER — Other Ambulatory Visit: Payer: Self-pay | Admitting: Family Medicine

## 2024-01-04 DIAGNOSIS — E7800 Pure hypercholesterolemia, unspecified: Secondary | ICD-10-CM

## 2024-01-21 ENCOUNTER — Ambulatory Visit

## 2024-02-03 ENCOUNTER — Ambulatory Visit

## 2024-02-10 ENCOUNTER — Ambulatory Visit

## 2024-02-18 ENCOUNTER — Ambulatory Visit

## 2024-05-06 ENCOUNTER — Encounter: Payer: Self-pay | Admitting: Internal Medicine
